# Patient Record
Sex: Female | Born: 1990
Health system: Southern US, Community
[De-identification: ages and names within clinical notes are randomized; demographics above are authoritative.]

## PROBLEM LIST (undated history)

## (undated) ENCOUNTER — Inpatient Hospital Stay (HOSPITAL_COMMUNITY): Payer: Self-pay

## (undated) DIAGNOSIS — F419 Anxiety disorder, unspecified: Secondary | ICD-10-CM

## (undated) DIAGNOSIS — A499 Bacterial infection, unspecified: Secondary | ICD-10-CM

## (undated) DIAGNOSIS — Z8619 Personal history of other infectious and parasitic diseases: Secondary | ICD-10-CM

## (undated) HISTORY — DX: Personal history of other infectious and parasitic diseases: Z86.19

## (undated) HISTORY — DX: Bacterial infection, unspecified: A49.9

---
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
## 2012-01-02 ENCOUNTER — Encounter: Payer: Self-pay | Admitting: Obstetrics and Gynecology

## 2012-02-03 ENCOUNTER — Encounter (INDEPENDENT_AMBULATORY_CARE_PROVIDER_SITE_OTHER): Payer: Medicaid Other

## 2012-02-03 DIAGNOSIS — Z331 Pregnant state, incidental: Secondary | ICD-10-CM

## 2012-02-18 ENCOUNTER — Encounter (INDEPENDENT_AMBULATORY_CARE_PROVIDER_SITE_OTHER): Payer: Medicaid Other | Admitting: Obstetrics and Gynecology

## 2012-02-18 DIAGNOSIS — Z3480 Encounter for supervision of other normal pregnancy, unspecified trimester: Secondary | ICD-10-CM

## 2012-02-18 DIAGNOSIS — N390 Urinary tract infection, site not specified: Secondary | ICD-10-CM

## 2012-02-18 DIAGNOSIS — R300 Dysuria: Secondary | ICD-10-CM

## 2012-02-18 DIAGNOSIS — Z202 Contact with and (suspected) exposure to infections with a predominantly sexual mode of transmission: Secondary | ICD-10-CM

## 2012-02-24 ENCOUNTER — Encounter (INDEPENDENT_AMBULATORY_CARE_PROVIDER_SITE_OTHER): Payer: Medicaid Other | Admitting: Obstetrics and Gynecology

## 2012-02-24 DIAGNOSIS — Z3480 Encounter for supervision of other normal pregnancy, unspecified trimester: Secondary | ICD-10-CM

## 2012-03-04 ENCOUNTER — Encounter (INDEPENDENT_AMBULATORY_CARE_PROVIDER_SITE_OTHER): Payer: Medicaid Other | Admitting: Obstetrics and Gynecology

## 2012-03-04 DIAGNOSIS — Z331 Pregnant state, incidental: Secondary | ICD-10-CM

## 2012-03-08 ENCOUNTER — Encounter (HOSPITAL_COMMUNITY): Payer: Self-pay | Admitting: Anesthesiology

## 2012-03-08 ENCOUNTER — Encounter (HOSPITAL_COMMUNITY): Payer: Self-pay | Admitting: *Deleted

## 2012-03-08 ENCOUNTER — Inpatient Hospital Stay (HOSPITAL_COMMUNITY)
Admission: AD | Admit: 2012-03-08 | Discharge: 2012-03-11 | DRG: 775 | Disposition: A | Discharge status: Home / Self Care | Payer: Managed Care, Other (non HMO) | Attending: Obstetrics and Gynecology | Admitting: Obstetrics and Gynecology

## 2012-03-08 ENCOUNTER — Inpatient Hospital Stay (HOSPITAL_COMMUNITY): Payer: Managed Care, Other (non HMO) | Admitting: Anesthesiology

## 2012-03-08 DIAGNOSIS — IMO0001 Reserved for inherently not codable concepts without codable children: Secondary | ICD-10-CM | POA: Diagnosis present

## 2012-03-08 DIAGNOSIS — D72829 Elevated white blood cell count, unspecified: Secondary | ICD-10-CM | POA: Diagnosis not present

## 2012-03-08 DIAGNOSIS — O99820 Streptococcus B carrier state complicating pregnancy: Secondary | ICD-10-CM

## 2012-03-08 DIAGNOSIS — Z8659 Personal history of other mental and behavioral disorders: Secondary | ICD-10-CM

## 2012-03-08 DIAGNOSIS — O99892 Other specified diseases and conditions complicating childbirth: Principal | ICD-10-CM | POA: Diagnosis present

## 2012-03-08 DIAGNOSIS — Z803 Family history of malignant neoplasm of breast: Secondary | ICD-10-CM

## 2012-03-08 DIAGNOSIS — Z22330 Carrier of Group B streptococcus: Secondary | ICD-10-CM

## 2012-03-08 DIAGNOSIS — O99013 Anemia complicating pregnancy, third trimester: Secondary | ICD-10-CM | POA: Diagnosis not present

## 2012-03-08 DIAGNOSIS — O9081 Anemia of the puerperium: Secondary | ICD-10-CM | POA: Diagnosis present

## 2012-03-08 LAB — CBC
HCT: 37.4 % (ref 36.0–46.0)
Hemoglobin: 12.4 g/dL (ref 12.0–15.0)
MCH: 31 pg (ref 26.0–34.0)
MCHC: 33.2 g/dL (ref 30.0–36.0)
MCV: 93.5 fL (ref 78.0–100.0)
Platelets: 208 K/uL (ref 150–400)
RBC: 4 MIL/uL (ref 3.87–5.11)
RDW: 12.9 % (ref 11.5–15.5)
WBC: 13.9 K/uL — ABNORMAL HIGH (ref 4.0–10.5)

## 2012-03-08 LAB — AMNISURE RUPTURE OF MEMBRANE (ROM) NOT AT ARMC: Amnisure ROM: POSITIVE

## 2012-03-08 LAB — RPR: RPR Ser Ql: NONREACTIVE

## 2012-03-08 LAB — STREP B DNA PROBE: GBS: POSITIVE

## 2012-03-08 LAB — POCT FERN TEST: Fern Test: POSITIVE

## 2012-03-08 MED ORDER — IBUPROFEN 600 MG PO TABS
600.0000 mg | ORAL_TABLET | Freq: Four times a day (QID) | ORAL | Status: DC | PRN
  Administered 2012-03-09: 600 mg via ORAL
  Filled 2012-03-08: qty 1

## 2012-03-08 MED ORDER — PHENYLEPHRINE 40 MCG/ML (10ML) SYRINGE FOR IV PUSH (FOR BLOOD PRESSURE SUPPORT): 80.0000 ug | PREFILLED_SYRINGE | INTRAVENOUS | Status: DC | PRN

## 2012-03-08 MED ORDER — FLEET ENEMA 7-19 GM/118ML RE ENEM: 1.0000 | ENEMA | RECTAL | Status: DC | PRN

## 2012-03-08 MED ORDER — OXYTOCIN BOLUS FROM INFUSION
500.0000 mL | Freq: Once | INTRAVENOUS | Status: DC
  Filled 2012-03-08: qty 500

## 2012-03-08 MED ORDER — OXYTOCIN 20 UNITS IN LACTATED RINGERS INFUSION - SIMPLE
1.0000 m[IU]/min | INTRAVENOUS | Status: DC
  Administered 2012-03-09: 333 m[IU]/min via INTRAVENOUS

## 2012-03-08 MED ORDER — HYDROXYZINE HCL 50 MG PO TABS: 50.0000 mg | ORAL_TABLET | Freq: Four times a day (QID) | ORAL | Status: DC | PRN

## 2012-03-08 MED ORDER — ACETAMINOPHEN 325 MG PO TABS: 650.0000 mg | ORAL_TABLET | ORAL | Status: DC | PRN

## 2012-03-08 MED ORDER — EPHEDRINE 5 MG/ML INJ: 10.0000 mg | INTRAVENOUS | Status: DC | PRN

## 2012-03-08 MED ORDER — HYDROXYZINE HCL 50 MG/ML IM SOLN
50.0000 mg | Freq: Four times a day (QID) | INTRAMUSCULAR | Status: DC | PRN
  Filled 2012-03-08: qty 1

## 2012-03-08 MED ORDER — FENTANYL 2.5 MCG/ML BUPIVACAINE 1/10 % EPIDURAL INFUSION (WH - ANES)
14.0000 mL/h | INTRAMUSCULAR | Status: DC
  Administered 2012-03-08 (×3): 14 mL/h via EPIDURAL
  Filled 2012-03-08 (×3): qty 60

## 2012-03-08 MED ORDER — LACTATED RINGERS IV SOLN
INTRAVENOUS | Status: DC
  Administered 2012-03-08: 950 mL via INTRAVENOUS
  Administered 2012-03-08 (×2): via INTRAVENOUS

## 2012-03-08 MED ORDER — PHENYLEPHRINE 40 MCG/ML (10ML) SYRINGE FOR IV PUSH (FOR BLOOD PRESSURE SUPPORT)
80.0000 ug | PREFILLED_SYRINGE | INTRAVENOUS | Status: DC | PRN
  Filled 2012-03-08: qty 5

## 2012-03-08 MED ORDER — OXYCODONE-ACETAMINOPHEN 5-325 MG PO TABS: 1.0000 | ORAL_TABLET | ORAL | Status: DC | PRN

## 2012-03-08 MED ORDER — FENTANYL 2.5 MCG/ML BUPIVACAINE 1/10 % EPIDURAL INFUSION (WH - ANES): 14.0000 mL/h | INTRAMUSCULAR | Status: DC

## 2012-03-08 MED ORDER — LIDOCAINE HCL (PF) 1 % IJ SOLN
INTRAMUSCULAR | Status: DC | PRN
  Administered 2012-03-08 (×3): 4 mL

## 2012-03-08 MED ORDER — ONDANSETRON HCL 4 MG/2ML IJ SOLN: 4.0000 mg | Freq: Four times a day (QID) | INTRAMUSCULAR | Status: DC | PRN

## 2012-03-08 MED ORDER — PENICILLIN G POTASSIUM 5000000 UNITS IJ SOLR
2.5000 10*6.[IU] | INTRAVENOUS | Status: DC
  Administered 2012-03-08 (×4): 2.5 10*6.[IU] via INTRAVENOUS
  Filled 2012-03-08 (×9): qty 2.5

## 2012-03-08 MED ORDER — EPHEDRINE 5 MG/ML INJ
10.0000 mg | INTRAVENOUS | Status: DC | PRN
  Filled 2012-03-08: qty 4

## 2012-03-08 MED ORDER — OXYTOCIN 20 UNITS IN LACTATED RINGERS INFUSION - SIMPLE: 125.0000 mL/h | Freq: Once | INTRAVENOUS | Status: DC

## 2012-03-08 MED ORDER — LACTATED RINGERS IV SOLN: 500.0000 mL | Freq: Once | INTRAVENOUS | Status: DC

## 2012-03-08 MED ORDER — DIPHENHYDRAMINE HCL 50 MG/ML IJ SOLN: 12.5000 mg | INTRAMUSCULAR | Status: DC | PRN

## 2012-03-08 MED ORDER — LACTATED RINGERS IV SOLN
500.0000 mL | INTRAVENOUS | Status: DC | PRN
  Administered 2012-03-08 (×2): 300 mL via INTRAVENOUS

## 2012-03-08 MED ORDER — PENICILLIN G POTASSIUM 5000000 UNITS IJ SOLR
5.0000 10*6.[IU] | Freq: Once | INTRAVENOUS | Status: AC
  Administered 2012-03-08: 5 10*6.[IU] via INTRAVENOUS
  Filled 2012-03-08: qty 5

## 2012-03-08 MED ORDER — CITRIC ACID-SODIUM CITRATE 334-500 MG/5ML PO SOLN: 30.0000 mL | ORAL | Status: DC | PRN

## 2012-03-08 MED ORDER — LIDOCAINE HCL (PF) 1 % IJ SOLN
30.0000 mL | INTRAMUSCULAR | Status: DC | PRN
  Filled 2012-03-08: qty 30

## 2012-03-08 MED ORDER — BUTORPHANOL TARTRATE 2 MG/ML IJ SOLN
1.0000 mg | INTRAMUSCULAR | Status: DC | PRN
  Administered 2012-03-08 (×2): 1 mg via INTRAVENOUS
  Filled 2012-03-08 (×2): qty 1

## 2012-03-08 MED ORDER — OXYTOCIN 20 UNITS IN LACTATED RINGERS INFUSION - SIMPLE
1.0000 m[IU]/min | INTRAVENOUS | Status: DC
  Administered 2012-03-08: 1 m[IU]/min via INTRAVENOUS
  Filled 2012-03-08: qty 1000

## 2012-03-08 MED ORDER — TERBUTALINE SULFATE 1 MG/ML IJ SOLN: 0.2500 mg | Freq: Once | INTRAMUSCULAR | Status: AC | PRN

## 2012-03-08 NOTE — Progress Notes (Signed)
Subjective: Called to bedside around 2100 b/c pt c/o intermittent "pressure."  Pitocin on 5mu currently.  Objective: BP 99/85  Pulse 85  Temp(Src) 98 F (36.7 C) (Oral)  Resp 18  Ht 4\' 9"  (1.448 m)  Wt 66.225 kg (146 lb)  BMI 31.59 kg/m2  SpO2 100%  LMP 06/14/2011      FHT:  FHR: 140 bpm, variability: moderate,  accelerations:  Present,  decelerations:  Present mild variables UC:   regular, every 2-5 minutes; couplet/triplet pattern at times; MVU's <180 SVE:   Dilation: 9 Effacement (%): 100 Station: -1 Exam by:: H. Esmerelda Finnigan CNM  Labs: Lab Results  Component Value Date   WBC 13.9* 03/08/2012   HGB 12.4 03/08/2012   HCT 37.4 03/08/2012   MCV 93.5 03/08/2012   PLT 208 03/08/2012    Assessment / Plan: 1.  Transition  2.  GBS pos  3.  mild variables  4.  Inadequate MVU's but cervical change  Labor: Progressing normally Preeclampsia:  no signs or symptoms of toxicity Fetal Wellbeing:  Category II Pain Control:  Epidural I/D:  n/a Anticipated MOD:  NSVD 1.  Repositioned to pt's Rt side and will recheck prn or in 1-2 hrs. 2. Amnioinfusion prn 3.  Dr. Normand Sloop updated Antonietta Breach 03/08/2012, 9:25 PM

## 2012-03-08 NOTE — Progress Notes (Signed)
  Subjective: Resting comfortably on right side.  Has received 2nd dose of PCN, on pitocin.  No significant pain with contractions.  Plans pain medication as labor advances.  Objective: BP 110/45  Pulse 77  Temp(Src) 97.8 F (36.6 C) (Oral)  Resp 18  Ht 4\' 9"  (1.448 m)  Wt 146 lb (66.225 kg)  BMI 31.59 kg/m2  LMP 06/14/2011      FHT: Category 1 UC:   Irregular, mild Pitocin on 3 mu/min  Labs: Lab Results  Component Value Date   WBC 13.9* 03/08/2012   HGB 12.4 03/08/2012   HCT 37.4 03/08/2012   MCV 93.5 03/08/2012   PLT 208 03/08/2012    Assessment / Plan: SROM, early labor Positive GBS  Plan: Continue augmentation of labor Pain medication prn.   Kathrynne Kulinski 03/08/2012, 7:59 AM
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
## 2012-03-08 NOTE — Anesthesia Procedure Notes (Signed)
Epidural Patient location during procedure: OB Start time: 03/08/2012 3:42 PM Reason for block: procedure for pain  Staffing Performed by: anesthesiologist   Preanesthetic Checklist Completed: patient identified, site marked, surgical consent, pre-op evaluation, timeout performed, IV checked, risks and benefits discussed and monitors and equipment checked  Epidural Patient position: sitting Prep: site prepped and draped and DuraPrep Patient monitoring: continuous pulse ox and blood pressure Approach: midline Injection technique: LOR air  Needle:  Needle type: Tuohy  Needle gauge: 17 G Needle length: 9 cm Needle insertion depth: 4 cm Catheter type: closed end flexible Catheter size: 19 Gauge Catheter at skin depth: 9 cm Test dose: negative  Assessment Events: blood not aspirated, injection not painful, no injection resistance, negative IV test and no paresthesia  Additional Notes Discussed risk of headache, infection, bleeding, nerve injury and failed or incomplete block.  Patient voices understanding and wishes to proceed.

## 2012-03-08 NOTE — Progress Notes (Addendum)
  Subjective: Comfortable with epidural.  RN reports pitocin turned off at approx 3:30pm at the direction of the Rapid Response nurse due to cycle of decreased variability, but I was never notified.  Objective: BP 106/59  Pulse 66  Temp(Src) 98.1 F (36.7 C) (Oral)  Resp 18  Ht 4\' 9"  (1.448 m)  Wt 146 lb (66.225 kg)  BMI 31.59 kg/m2  SpO2 100%  LMP 06/14/2011      FHT:  Category 1 UC:   q 4-8 min, mild. SVE:   Dilation: 7 Effacement (%): 100 Station: -1 Exam by:: V Antolin Belsito cnm IUPC inserted. Pitocin restarted at 2 mu/min.  Labs: Lab Results  Component Value Date   WBC 13.9* 03/08/2012   HGB 12.4 03/08/2012   HCT 37.4 03/08/2012   MCV 93.5 03/08/2012   PLT 208 03/08/2012    Assessment / Plan: Inadequate labor Plan: Restart pitocin now.   Nigel Bridgeman 03/08/2012, 7:04 PM

## 2012-03-08 NOTE — Anesthesia Preprocedure Evaluation (Signed)

## 2012-03-08 NOTE — MAU Note (Signed)
Pt reports "I'm having bad pains and a discharge". States the discharge was only one time today. uc's are q 4 minutes

## 2012-03-08 NOTE — Progress Notes (Signed)
  Subjective: Resting quietly--breathing with contractions.  Mother, grandmother, and FOB at bedside.  Objective: BP 131/64  Pulse 83  Temp(Src) 97.9 F (36.6 C) (Oral)  Resp 18  Ht 4\' 9"  (1.448 m)  Wt 146 lb (66.225 kg)  BMI 31.59 kg/m2  LMP 06/14/2011      FHT: Category 1 UC:   q 2-3 min. SVE:   Dilation: 6 Effacement (%): 80 Station: -2 Exam by:: V Aydin Hink CNM AROM of forewaters--clear fluid, vtx well applied.  Assessment / Plan: Progressive labor Will continue to observe, continue augmentation.   Nigel Bridgeman 03/08/2012, 2:35 PM

## 2012-03-08 NOTE — Progress Notes (Signed)
  Subjective: Requested more pain medication--not yet decided on epidural.  Objective: BP 112/61  Pulse 72  Temp(Src) 97.9 F (36.6 C) (Oral)  Resp 18  Ht 4\' 9"  (1.448 m)  Wt 146 lb (66.225 kg)  BMI 31.59 kg/m2  LMP 06/14/2011      FHT:  Category 1 UC:   q 3-4 min. SVE:   Dilation: 4.5 Effacement (%): 80 Station: -2 Exam by:: V Cheney Gosch cnm ? Forewaters noted. Pitocin on 8 mu/min  Labs: Lab Results  Component Value Date   WBC 13.9* 03/08/2012   HGB 12.4 03/08/2012   HCT 37.4 03/08/2012   MCV 93.5 03/08/2012   PLT 208 03/08/2012    Assessment / Plan: Active labor, s/p SROM  Plan: Continue to augment. Pain medication prn.  Nigel Bridgeman 03/08/2012, 1:35 PM

## 2012-03-08 NOTE — Progress Notes (Signed)
  Subjective: Requesting pain medication--having back labor.    Objective: BP 110/60  Pulse 80  Temp(Src) 97.8 F (36.6 C) (Oral)  Resp 18  Ht 4\' 9"  (1.448 m)  Wt 146 lb (66.225 kg)  BMI 31.59 kg/m2  LMP 06/14/2011      FHT: Category 1 UC:   q 3-4 min, moderate. SVE:   3 cm, 80%, -2 per RN exam. Pitocin on 8 mu/min  Labs: Lab Results  Component Value Date   WBC 13.9* 03/08/2012   HGB 12.4 03/08/2012   HCT 37.4 03/08/2012   MCV 93.5 03/08/2012   PLT 208 03/08/2012    Assessment / Plan: IUP at term Early labor Positive GBS  Plan: Continue current care.      Nigel Bridgeman 03/08/2012, 10:54 AM

## 2012-03-09 ENCOUNTER — Encounter (HOSPITAL_COMMUNITY): Payer: Self-pay | Admitting: *Deleted

## 2012-03-09 DIAGNOSIS — B951 Streptococcus, group B, as the cause of diseases classified elsewhere: Secondary | ICD-10-CM

## 2012-03-09 LAB — CBC
MCH: 31.1 pg (ref 26.0–34.0)
MCHC: 33.1 g/dL (ref 30.0–36.0)
Platelets: 169 10*3/uL (ref 150–400)
RBC: 3.51 MIL/uL — ABNORMAL LOW (ref 3.87–5.11)
RDW: 13 % (ref 11.5–15.5)

## 2012-03-09 MED ORDER — PRENATAL MULTIVITAMIN CH
1.0000 | ORAL_TABLET | Freq: Every day | ORAL | Status: DC
  Administered 2012-03-09 – 2012-03-10 (×2): 1 via ORAL
  Filled 2012-03-09 (×3): qty 1

## 2012-03-09 MED ORDER — MEDROXYPROGESTERONE ACETATE 150 MG/ML IM SUSP: 150.0000 mg | INTRAMUSCULAR | Status: DC | PRN

## 2012-03-09 MED ORDER — ACETAMINOPHEN 500 MG PO TABS
1000.0000 mg | ORAL_TABLET | Freq: Once | ORAL | Status: AC
  Administered 2012-03-09: 1000 mg via ORAL
  Filled 2012-03-09: qty 2

## 2012-03-09 MED ORDER — DIBUCAINE 1 % RE OINT: 1.0000 "application " | TOPICAL_OINTMENT | RECTAL | Status: DC | PRN

## 2012-03-09 MED ORDER — SENNOSIDES-DOCUSATE SODIUM 8.6-50 MG PO TABS
2.0000 | ORAL_TABLET | Freq: Every day | ORAL | Status: DC
  Administered 2012-03-09: 2 via ORAL

## 2012-03-09 MED ORDER — OXYCODONE-ACETAMINOPHEN 5-325 MG PO TABS: 1.0000 | ORAL_TABLET | ORAL | Status: DC | PRN

## 2012-03-09 MED ORDER — ONDANSETRON HCL 4 MG/2ML IJ SOLN
4.0000 mg | INTRAMUSCULAR | Status: DC | PRN
Start: 2012-03-09 — End: 2012-03-11

## 2012-03-09 MED ORDER — LANOLIN HYDROUS EX OINT: TOPICAL_OINTMENT | CUTANEOUS | Status: DC | PRN

## 2012-03-09 MED ORDER — ZOLPIDEM TARTRATE 5 MG PO TABS: 5.0000 mg | ORAL_TABLET | Freq: Every evening | ORAL | Status: DC | PRN

## 2012-03-09 MED ORDER — BENZOCAINE-MENTHOL 20-0.5 % EX AERO: 1.0000 "application " | INHALATION_SPRAY | CUTANEOUS | Status: DC | PRN

## 2012-03-09 MED ORDER — SIMETHICONE 80 MG PO CHEW
80.0000 mg | CHEWABLE_TABLET | ORAL | Status: DC | PRN
  Administered 2012-03-10: 80 mg via ORAL

## 2012-03-09 MED ORDER — WITCH HAZEL-GLYCERIN EX PADS: 1.0000 "application " | MEDICATED_PAD | CUTANEOUS | Status: DC | PRN

## 2012-03-09 MED ORDER — DIPHENHYDRAMINE HCL 25 MG PO CAPS: 25.0000 mg | ORAL_CAPSULE | Freq: Four times a day (QID) | ORAL | Status: DC | PRN

## 2012-03-09 MED ORDER — MISOPROSTOL 200 MCG PO TABS
800.0000 ug | ORAL_TABLET | Freq: Once | ORAL | Status: AC
  Administered 2012-03-09: 800 ug via RECTAL

## 2012-03-09 MED ORDER — ONDANSETRON HCL 4 MG PO TABS: 4.0000 mg | ORAL_TABLET | ORAL | Status: DC | PRN

## 2012-03-09 MED ORDER — MISOPROSTOL 200 MCG PO TABS
ORAL_TABLET | ORAL | Status: AC
  Filled 2012-03-09: qty 4

## 2012-03-09 MED ORDER — MAGNESIUM HYDROXIDE 400 MG/5ML PO SUSP: 30.0000 mL | ORAL | Status: DC | PRN

## 2012-03-09 MED ORDER — IBUPROFEN 600 MG PO TABS
600.0000 mg | ORAL_TABLET | Freq: Four times a day (QID) | ORAL | Status: DC
  Administered 2012-03-09 – 2012-03-11 (×9): 600 mg via ORAL
  Filled 2012-03-09 (×12): qty 1

## 2012-03-09 MED ORDER — TETANUS-DIPHTH-ACELL PERTUSSIS 5-2.5-18.5 LF-MCG/0.5 IM SUSP
0.5000 mL | Freq: Once | INTRAMUSCULAR | Status: AC
  Administered 2012-03-10: 0.5 mL via INTRAMUSCULAR
  Filled 2012-03-09: qty 0.5

## 2012-03-09 NOTE — Anesthesia Postprocedure Evaluation (Signed)
  Anesthesia Post Note  Patient: Brandi Castillo  Procedure(s) Performed: * No procedures listed *  Anesthesia type: Epidural  Patient location: Mother/Baby  Post pain: Pain level controlled  Post assessment: Post-op Vital signs reviewed  Last Vitals:  Filed Vitals:   03/09/12 0700  BP: 102/67  Pulse: 82  Temp: 37.2 C  Resp: 20    Post vital signs: Reviewed  Level of consciousness:alert  Complications: No apparent anesthesia complications

## 2012-03-09 NOTE — Progress Notes (Addendum)
Post Partum Day 0--s/p SVB Subjective: no complaints.  Up ad lib, no syncope or dizziness.  Undecided regarding contraception.  Breast feeding.  Objective: Blood pressure 102/67, pulse 82, temperature 98.9 F (37.2 C), temperature source Oral, resp. rate 20, height 4\' 9"  (1.448 m), weight 146 lb (66.225 kg), last menstrual period 06/14/2011, SpO2 100.00%.  Physical Exam:  General: alert Lochia: appropriate Uterine Fundus: firm Incision: healing well DVT Evaluation: No evidence of DVT seen on physical exam. Negative Homan's sign.   Basename 03/09/12 0545 03/08/12 0320  HGB 10.9* 12.4  HCT 32.9* 37.4    Assessment/Plan: PP day 0 Continue current care. Reviewed contraceptive options--will review again before d/c.   LOS: 1 day   Generoso Cropper 03/09/2012, 10:42 AM

## 2012-03-10 NOTE — Progress Notes (Signed)
Post Partum Day 1--s/p SVB Subjective: no complaints. Doing well.  Infant having some spitting episodes, but doing well overall.  Objective: Blood pressure 105/68, pulse 71, temperature 98 F (36.7 C), temperature source Oral, resp. rate 18, height 4\' 9"  (1.448 m), weight 146 lb (66.225 kg), last menstrual period 06/14/2011, SpO2 100.00%, unknown if currently breastfeeding.  Physical Exam:  General: alert Lochia: appropriate Uterine Fundus: firm Incision: healing well DVT Evaluation: No evidence of DVT seen on physical exam. Negative Homan's sign.   Basename 03/09/12 0545 03/08/12 0320  HGB 10.9* 12.4  HCT 32.9* 37.4    Assessment/Plan: Continue current care Anticipate d/c tomorrow.   LOS: 2 days   Nigel Bridgeman 03/10/2012, 9:58 AM

## 2012-03-11 ENCOUNTER — Encounter: Payer: Medicaid Other | Admitting: Obstetrics and Gynecology

## 2012-03-11 DIAGNOSIS — O9081 Anemia of the puerperium: Secondary | ICD-10-CM | POA: Diagnosis present

## 2012-03-11 DIAGNOSIS — D72829 Elevated white blood cell count, unspecified: Secondary | ICD-10-CM | POA: Diagnosis not present

## 2012-03-11 LAB — CBC
Platelets: 176 10*3/uL (ref 150–400)
RDW: 13.3 % (ref 11.5–15.5)
WBC: 11.9 10*3/uL — ABNORMAL HIGH (ref 4.0–10.5)

## 2012-03-11 LAB — DIFFERENTIAL
Basophils Absolute: 0 10*3/uL (ref 0.0–0.1)
Basophils Relative: 0 % (ref 0–1)
Lymphocytes Relative: 26 % (ref 12–46)
Neutro Abs: 7.6 10*3/uL (ref 1.7–7.7)
Neutrophils Relative %: 63 % (ref 43–77)

## 2012-03-11 MED ORDER — IBUPROFEN 600 MG PO TABS: 600.0000 mg | ORAL_TABLET | Freq: Four times a day (QID) | ORAL | Status: AC | PRN

## 2012-03-11 NOTE — Discharge Summary (Signed)
Obstetric Discharge Summary Reason for Admission: rupture of membranes Prenatal Procedures: ultrasound Intrapartum Procedures: spontaneous vaginal delivery, GBS prophylaxis and epidural Postpartum Procedures: Tdap Complications-Operative and Postpartum: mild fever immediate PP--resolved w/ interventions and none since; Leukocytosis, but decreasing WBC before d/c home Hemoglobin  Date Value Range Status  03/11/2012 10.1* 12.0-15.0 (g/dL) Final     HCT  Date Value Range Status  03/11/2012 31.1* 36.0-46.0 (%) Final  Hospital Course: Pt presented on night of admission for labor check and found to be ruptured since 0120, with cx=1.5/60/-2.  Pt's labor was augmented with Pitocin.  At 1052 am, cx 3/80/-2.  Just before noon cx=4.5/80/-2 and Pitocin on 8mu.  AROM of forebag around 1435 and cx=6/80/-1.  She did receive IV pain medicine initially before proceeding with epidural around 1540.  Pitocin was discontinued around 1530 by rapid response RN and CNM o/c not notified until change of shift, and Pitocin was restarted at 1900, when IUPC inserted by Nigel Bridgeman, CNM (cx=7/100/-1).  Mild intermittent variables noted through transition and pushing.  Pt complete at 2210, and pushing started at 2215.  SVD at 0034 w/ LNCx1 and double leg cord.  Apgars 2, 8 and cord pH=7.213.   Pt's pp recovery has been unremarkable and BF'ng established.  She has tol diet, up ad lib, and voiding w/o difficulty and PP BM.  Undecided on BC; abstinence until PP appt.     Physical Exam: please see today's progress note.  Discharge Diagnoses: Term Pregnancy-delivered and Lactating; h/o anxiety--stable on no meds; Mild fever immediate PP--none since; Leukocytosis PP--decreasing WBC before d/c home; Prelabor ROM.    Discharge Information: Date: 03/11/2012 PPD#2 Activity: pelvic rest Diet: routine Medications: PNV, Ibuprofen and Colace Condition: stable Instructions: refer to practice specific booklet Discharge to:  home Follow-up Information    Follow up with [facility]. Schedule an appointment as soon as possible for a visit in 6 weeks. (or call as needed with any questions or concerns)          Newborn Data: Live born female "Shaune Spittle"  (delivery provider:  C. Denny Levy, CNM) Birth Weight: 6 lb 7 oz (2920 g) APGAR: 2, 8  Cord pH=7.213  Home with mother.  Roland Prine H 03/11/2012, 10:37 AM

## 2012-03-11 NOTE — Discharge Instructions (Signed)
Postpartum Care After Vaginal Delivery  After you deliver your baby, you will stay in the hospital for 24 to 72 hours, unless there were problems with the labor or delivery, or you have medical problems. While you are in the hospital, you will receive help and instructions on how to care for yourself and your baby.  Your doctor will order pain medicine, in case you need it. You will have a small amount of bleeding from your vagina and should change your sanitary pad frequently. Wash your hands thoroughly with soap and water for at least 20 seconds after changing pads and using the toilet. Let the nurses know if you begin to pass blood clots or your bleeding increases. Do not flush blood clots down the toilet before having the nurse look at them, to make sure there is no placental tissue with them.  If you had an intravenous (IV), it will be removed within 24 hours, if there are no problems. The first time you get out of bed or take a shower, call the nurse to help you because you may get weak, lightheaded, or even faint. If you are breastfeeding, you may feel painful contractions of your uterus for a couple of weeks. This is normal. The contractions help your uterus get back to normal size. If you are not breastfeeding, wear a supportive bra and handle your breasts as little as possible until your milk has dried up. Hormones should not be given to dry up the breasts, because they can cause blood clots. You will be given your normal diet, unless you have diabetes or other medical problems.   The nurses may put an ice pack on your episiotomy (surgically enlarged opening), if you have one, to reduce the pain and swelling. On rare occasions, you may not be able to urinate and the nurse will need to empty your bladder with a catheter. If you had a postpartum tubal ligation ("tying tubes," female sterilization), it should not make your stay in the hospital longer.  You may have your baby in your room with you as much as  you like, unless you or the baby has a problem. Use the bassinet (basket) for the baby when going to and from the nursery. Do not carry the baby. Do not leave the postpartum area. If the mother is Rh negative (lacks a protein on the red blood cells) and the baby is Rh positive, the mother should get a Rho-gam shot to prevent Rh problems with future pregnancies.  You may be given written instructions for you and your baby, and necessary medicines, when you are discharged from the hospital. Be sure you understand and follow the instructions as advised.  HOME CARE INSTRUCTIONS   · Follow instructions and take the medicines given to you.   · Only take over-the-counter or prescription medicines for pain, discomfort, or fever as directed by your caregiver.   · Do not take aspirin, because it can cause bleeding.   · Increase your activities a little bit every day to build up your strength and endurance.   · Do not drink alcohol, especially if you are breastfeeding or taking pain medicine.   · Take your temperature twice a day and record it.   · You may have a small amount of bleeding or spotting for 2 to 4 weeks. This is normal.   · Do not use tampons or douche. Use sanitary pads.   · Try to have someone stay and help you for a   the baby is sleeping.   If you are breastfeeding, wear a good support bra. If you are not breastfeeding, wear a supportive bra and do not stimulate your nipples.   Eat a healthy, nutritious diet and continue to take your prenatal vitamins.   Do not drive, do any heavy activities, or travel until your caregiver tells you it is okay.   Do not have intercourse until your caregiver gives you permission to do so.   Ask your caregiver when you can begin to exercise and what type of exercises to do.   Call your caregiver if you think you are having a problem from your delivery.   Call your pediatrician if you are having a problem  with the baby.   Schedule your postpartum visit and keep it.  SEEK MEDICAL CARE IF:   You have a temperature of 100 F (37.8 C) or higher.   You have increased vaginal bleeding or are passing clots. Save any clots to show your caregiver.   You have bloody urine or pain when you urinate.   You have a bad smelling vaginal discharge.   You have increasing pain or swelling on your episiotomy.   You develop a severe headache.   You feel depressed.   The episiotomy is separating.   You become dizzy or lightheaded.   You develop a rash.   You have a reaction or problems with your medicine.   You have pain, redness, or swelling at the intravenous site.  SEEK IMMEDIATE MEDICAL CARE IF:   You have chest pain.   You develop shortness of breath.   You pass out.   You develop pain, with or without swelling or redness in your leg.   You develop heavy vaginal bleeding, with or without blood clots.   You develop stomach pain.   You develop a bad smelling vaginal discharge.  MAKE SURE YOU:   Understand these instructions.   Will watch your condition.   Will get help right away if you are not doing well or get worse.  Document Released: 09/13/2007 Document Revised: 11/05/2011 Document Reviewed: 09/25/2009 [employer] Patient Information 2012 [employer]  Breastfeeding Challenges Breastfeeding is often the best way to feed your baby. Challenges may discourage you from breastfeeding. But solutions can usually be found to help you. Some babies have conditions that may interfere with or make breastfeeding more difficult. But, in all of the following cases, breastfeeding is still best for a baby's health.  ADVANTAGES OF BREASTFEEDING  Breastfed babies tend to be healthier and less affected by disease.   Breastfed babies may have better brain development and be less likely to be overweight than formulafed babies.   Breastfeeding also benefits the mother. It will give you time  to be close to your baby and helps create a strong bond. It also:   Delays the return of your periods.   Stimulates your uterus to contract back to normal.   Helps you lose some of the weight you gained during pregnancy.   Breastfeeding is also cheaper than formula feeding. It also does not require mixing formula, heating bottles, or washing extra dishes.   Breastfeeding mothers have a lower risk of developing breast cancer.   Breastfeeding should be encouraged in women with gestational diabetes and diabetes type I and type II.  BREASTFEEDING CHALLENGES  Breastfeeding involves taking the time to get to know your baby's patterns and responding to his or her cues. Once breastfeeding is well established, feedings usually   more regular and more widely spaced. Some mothers do not nurse their babies because they come across problems early on. If at all possible, begin breastfeeding your baby within an hour after delivery. The first milk you produce is called colostrum. It is packed with nutrients and disease-fighting substances. These will help nourish and protect your baby against infections as he or she grows up.   Some babies are unable to breastfeed because of premature birth and small size along with weakness and difficulty sucking. Sometimes with birth defects of the mouth (cleft lip or cleft palate) the mother may be able to pump breastmilk to give to her baby. Some digestive problems (breast milk jaundice, galactosemia) may be reasons not to nurse. See a lactation consultant if you have a breast infection or breast abscess, breast cancer or other cancer, previous surgery or radiation treatment, or inadequate milk supply (uncommon).  SOME MOTHERS ARE ADVISED NOT TO BREASTFEED DUE TO HEALTH PROBLEMS SUCH AS:  Serious illnesses.   Severe malnutrition.   Undergoing radiation therapy.   Taking psychiatric medication.   Active herpes lesions on the breast.   Chickenpox or shingles.    Active, untreated tuberculosis.   HIV (human immunodeficiency virus) infection.   Drug or alcohol addiction.   Undergoing radioactive iodine therapy.   Leukemia human cell Type I or II.  BREASTFEEDING SHOULD NOT BE PAINFUL  It is natural for minor problems to arise in first time breastfeeding. Problems you may have and some solutions are as follows:   Nipple soreness may be caused by:   Improper position of baby.   Improper feeding techniques.   Improper nipple care.   For many women, there is no identified cause. A simple change in your baby's position while feeding may relieve nipple soreness. Some breastfeeding mothers report nipple soreness only during the initial adjustment period.   If there is tenderness at first, it should gradually go away as the days go by. Poor latch-on and positioning are common causes of sore nipples. This is usually because the baby is not getting enough of the areola into his or her mouth, and is sucking mostly on the nipple. The areola is the colored portion around the nipple. In general, nurse early and often. Nurse with the nipple and areola in the baby's mouth, not just the nipple. And feed your baby on demand.   If you have sore nipples and put off feedings because of the pain, this can lead to your breasts becoming overly full. This may lead to plugged milk ducts in the breast followed by engorgement or even infection of the breasts. If your baby is latched on correctly, he or she should be able to nurse as long as needed without causing any pain. If it hurts, take the baby off of your breast and try again. Ask for help if it is still painful for you.   Check the positioning of your baby's body and the way she latches on and sucks. To minimize soreness, your baby's mouth should be open wide with as much of the areola in his or her mouth as possible. You should find that it feels better right away once the baby is positioned correctly.   Do not  delay feedings. Try to relax so your let-down reflex comes easily. You also can hand-express a little milk before beginning the feeding so your baby does not clamp down harder, waiting for the milk to come.   If your nipples are very sore, it   sore, it may be helpful to change positions each time you nurse. This puts the pressure on a different part of the nipple.   After nursing, you can also express a few drops of milk and gently rub it on your nipples. Human milk has natural healing properties. Let your nipples air-dry after feeding, or wear a soft-cotton shirt.   Wearing a nipple shield during nursing will not relieve sore nipples. They actually can prolong soreness by making it hard for the baby to learn to nurse without the shield.   Avoid wearing bras or clothes that are too tight and put pressure on your nipples. If you wear a bra, get one that offers good support to your breasts.   Change nursing pads often to avoid trapping in moisture. Only use cotton pads.   Avoid using soap, ointments or other chemicals on your nipples. Make sure to avoid products that must be removed before nursing. Washing with clean water is all that is necessary to keep your nipples and breasts clean.   Try rubbing pure lanolin on your nipples after breastfeeding to soothe the pain.   Making sure you get enough rest, eating healthy foods, and getting enough fluids also can help the healing process. If you have very sore nipples, you can ask your caregiver about using non-aspirin pain relievers.   Another cause of sore nursing is a condition called thrush. This is a fungal infection that can form on your nipples from the milk. Other signs of thrush include itching, flaking and drying skin, tender or pink skin. The infection also can form in the baby's mouth from having contact with your nipples. There it appears as little white spots on the inside of the cheeks, gums, or tongue. It also can appear as a diaper rash on your  baby that will not go away by using regular diaper rash ointments. If you have any of these symptoms or think you have thrush, contact your doctor and your baby's doctor, or a lactation consultant. A lactation consultant is a breastfeeding consultant or expert. You can get medication for your nipples and for your baby.   If you still have sore nipples after following the above tips, you may need to see someone who is trained in breastfeeding, like a lactation consultant.  ENGORGEMENT Engorgement is a condition after pregnancy, when your breasts feel very hard and painful. You also may have breast swelling, tenderness, warmth, redness, throbbing and flattening of the nipple. Engorgement may cause a low-grade fever. This can be confused with a breast infection. Engorgement is the result of the milk building up. It usually happens during the third to fifth day after birth. This slows circulation. When blood and lymph move through the breasts, fluid from the blood vessels can seep into the breast tissues. All of the following can cause engorgement.  Poor positioning.   Infrequent feedings.   Giving supplementary bottles of water, juice, formula, or breast milk or using a pacifier. All of these cut down on your feeding and may lead to engorgement.   Changing the breastfeeding schedule with decreasing in feeding.   The baby changes the nursing pattern.   Having a baby with a weak suck who is not able to nurse effectively.   Fatigue, stress, or anemia in the mother.   An overabundant milk supply.   Nipple damage.   Breast abnormalities.  Engorgement can lead to plugged ducts or a breast infection. So it is important to try to prevent   this happens. If treated properly, engorgement should only last for one to two days.  Minimize engorgement by making sure the baby is latched on and positioned correctly at your breast.   Nurse frequently after birth. Allow the baby to nurse as long as  he/she likes, as long as he/she is latched on well and sucking effectively.   In the early days when your milk is coming in, you should awaken a sleepy baby every 2 to 3 hours to breastfeed. Breastfeeding often on the affected side helps to remove the milk and keeps milk moving freely. This prevents overfilling of the breast.   Avoid additional bottles and pacifiers.   Try hand expressing or pumping a little milk to first soften the breast, areola, and nipple before breastfeeding. Or massage the breast before feeding.   Cold compresses in between feedings can help ease pain and swelling.   If you are returning to work, try to pump your milk on the same schedule your baby was fed.   Eat a well balanced diet and drink plenty of fluids.   Use a well-fitting, supportive bra that is not too tight.   If your engorgement lasts for more than two days even after treating it, contact a lactation consultant.   Use a breast pump to keep up with your nursing schedule.   Use a breast pump if your baby is not taking enough milk or you feel you may be getting engorged.  PLUGGED DUCTS AND INFECTION Plugged ducts and breast infection (mastitis) are common sources of sore breasts postpartum. It is common for many women to have a plugged duct in the breast at some point if breastfeeding.   A plugged milk duct feels like a tender, sore, lump in the breast. It is not accompanied by a fever or other symptoms. It happens when a milk duct does not properly drain. Then, pressure builds up behind the plug, and surrounding tissue becomes inflamed. A plugged duct usually only occurs in one breast at a time.   A breast infection (mastitis), on the other hand, is soreness or a lump in the breast that can be accompanied by a fever and/or flu-like symptoms. You may feel run down or very achy. Some women with a breast infection also have nausea and vomiting. You also may have yellowish discharge from the nipple that looks  like colostrum. Or the breasts feel warm or hot to the touch and appear pink or red. A breast infection can occur when other family members have a cold or the flu. Like a plugged duct, it usually only occurs in one breast. It is not always easy to tell the difference between a breast infection and a plugged duct. Both have similar symptoms and can improve within 24 to 48 hours.   Treatment for plugged ducts and breast infections is similar. But some breast infections need to also be treated with an antibiotic.   If mastitis is not treated quickly, it may lead to a breast abscess.   It may help to massage the area, starting behind the sore spot. Use your fingers in a circular motion and massage toward the nipple.   Breastfeed more often on the affected side. This helps loosen the plug, keeps the milk moving freely, and the breast from becoming overly full. Nursing every two hours, both day and night on the affected side first can be helpful.   Getting extra sleep or relaxing with your feet up can help speed healing. Often   healing. Often a plugged duct or breast infection is the first sign that a mother is doing too much and becoming overly tired.   Wear a well-fitting supportive bra that is not too tight, since this can constrict milk ducts.   If you do not feel better within 24 hours of trying these steps, and you have a fever or your symptoms worsen, call your doctor. You may need an antibiotic. Also, if you have a breast infection in which both breasts look affected, or there is pus or blood in the milk, red streaks near the area, or your symptoms came on severely and suddenly, see your doctor right away.   Even if you need an antibiotic, continuing to breastfeed during treatment is best for both you and your baby. Most antibiotics will not affect your baby through your breast milk.  THRUSH Thrush (yeast) is a fungal infection that can form on your nipples or in your breast because it thrives on milk. The  infection forms from an overgrowth of the candida organism. Candida usually exists in our bodies and is kept at healthy levels by the natural bacteria in our bodies. But, when the natural balance of bacteria is upset, candida can overgrow, causing an infection. Some of the things that can cause thrush include:   Having an overly moist environment on your skin or nipples that are sore or cracked.   Taking antibiotics, birth control pills or steroids.   Having a diet that contains large amounts of sugar or foods with yeast.   Having a chronic illness like HIV infection, diabetes, or anemia.  If you have sore nipples that last more than a few days even after you make sure your baby's latch and positioning is correct, or you suddenly get sore nipples after several weeks of unpainful nursing, you could have thrush. Some other signs of thrush include:   Pink, flaky, shiny, itchy or cracked nipples.   Deep pink and blistered nipples.   You also could have shooting pains deep in the breast during or after feedings, or achy breasts.  The infection also can form in your baby's mouth from having contact with your nipples, and appear as little white spots on the inside of the cheeks, gums, or tongue. It also can appear as a diaper rash (small red dots around a rash) on your baby that will not go away by using regular diaper rash ointments. Many babies with thrush refuse to nurse, or are gassy or cranky.  Solution:   If you or your baby have any of these symptoms, contact your doctor and your baby's doctor so you both can be correctly diagnosed.   You can get medication for your nipples and for your baby. Medication for a mother is usually an ointment for the nipples. Your baby can be given a liquid medication for his/her mouth, and/or an ointment for any diaper rash.   Change disposable nursing pads often. Wash any towels or clothing that come in contact with the yeast in very hot water (above 122 F or  50 C).   Wear a clean bra every day. Wash your hands often, and wash your baby's hands often, especially if he or she sucks on his/her fingers.   Only use cotton pads.   Boil any pacifiers, bottle nipples, or toys your baby puts in his or her mouth once a day for 20 minutes to kill the thrush. After one week of treatment, discard pacifiers and nipples and buy new   Boil daily for 20 minutes all breast pump parts that touch the milk.   Make sure other family members are free of thrush or other fungal infections. If they have symptoms, get them treatment.  NURSING STRIKE A nursing strike is when your baby has been nursing well for months, then suddenly loses interest in breastfeeding and begins to refuse the breast. A nursing strike can mean several things are happening with your baby and that she or he is trying to communicate with you to let you know that something is wrong. Not all babies will react the same to different situations that can cause a nursing strike. Some will continue to breastfeed without a problem, others may just become fussy at the breast, and others will refuse the breast entirely. Some of the major causes of a nursing strike include:  Mouth pain from teething, a fungal infection, or a cold sore.   An ear infection.   Pain from a certain nursing position.   Being upset about a long separation from the mother or a major change in routine.   Being interested in other things around him or her.   A cold or stuffy nose that makes breathing difficult.   Reduced milk supply from supplementing with bottles or overuse of a pacifier.   Responding to the mother's strong reaction if the baby has bitten her.   Being upset about hearing arguing or people talking in a harsh voice with other family members while nursing.   Reacting to stress, over-stimulation, or having been repeatedly put off when wanting to nurse.   If your baby is on a nursing strike, it is normal to  feel frustrated and upset, especially if your baby is unhappy. It is important not to feel guilty or that you have done something wrong. Your breasts also may become uncomfortable as the milk builds up.  Treatment  Try to express your milk manually or with a breast pump on the same schedule as the baby used to breastfeed to avoid engorgement and plugged ducts.   Try another feeding method temporarily to give your baby your milk, such as a cup, dropper, or spoon. Keep track of your baby's wet diapers to make sure he/she is getting enough milk (5 to 6 per day).   Keep offering your breast to the baby. If the baby is frustrated, stop and try again later. Try when the baby is sleeping or very sleepy.   Try various breastfeeding positions.   Focus on the baby with all of your attention and comfort him or her with extra touching and cuddling.   Try nursing while rocking and in a quiet room free of distractions.  INVERTED OR LARGE NIPPLES Some women have nipples that naturally are inverted, or that turn inward instead of protruding, or that are flat and do not protrude. Inverted or flat nipples can sometimes make it harder to breastfeed because your baby can have a harder time latching on. But remember that for breastfeeding to work, your baby has to latch on to both the nipple and the breast, so even inverted nipples can work just fine. Very large nipples can make it hard for the baby to get enough of the areola into his or her mouth to compress the milk ducts and get enough milk.  Know what type of nipples you have before you have your baby, so you can be prepared in case you have a problem getting your baby to latch on correctly.     correctly.   Talk with a lactation consultant at the hospital or at a breastfeeding clinic for extra help if you have flat, inverted, or very large nipples.   Sometimes a lactation consultant can help inverted nipples to be pulled out with a small device before your baby is brought to  your breast.   In many cases, inverted nipples will protrude more as the baby starts to latch on and as time passes. The baby's sucking will help.   Flat nipples cause fewer problems than inverted nipples. Good latch-on and positioning are usually enough to ensure that a baby latched to a flat nipple breastfeeds well.   The latch for babies of mothers with very large nipples will improve with time as the baby grows. In some cases, it might take several weeks to get the baby to latch well. A good milk supply helps insure that her baby will get enough milk.  RETURNING TO WORK More and more women are breastfeeding when they return to work because they believe in the benefits of breastfeeding. You can purchase or rent effective breast pumps and storage containers for your milk. Many employers are willing to set up special rooms for mothers who pump. But others are not as educated about the benefits of breastfeeding. Also, many women are not able to take off as much time as they'd like after having their babies and might have to return to work before breastfeeding is well established. After you have your baby, take as much time off as possible. This will help you get your breastfeeding established well and may reduce the number of months you may need to pump your milk while you are at work.   If you plan to have your baby take a bottle of expressed breast milk while you are at work, you can introduce your baby to a bottle when he or she is around four weeks old. Otherwise, the baby might not accept the bottle later on. Once your baby is comfortable taking a bottle, it is a good idea to have Dad or another family member offer a bottle of pumped breast milk on a regular basis so the baby stays in practice.   Let your employer or human resources manager know that you plan to continue breastfeeding once you return to work. Before you return to work, or even before you have your baby, start talking with your  employer about breastfeeding. Do not be afraid to request a clean and private area where you can pump your milk. If you do not have your own office space, you can ask to use a supervisor's office during certain times. Or you can ask to have a clean, clutter free corner of a storage room. All you need is a chair, a small table, and an outlet if you are using an electric pump. Many electric pumps also can run on batteries and do not require an outlet. You can lock the door and place a small sign on it that asks for some privacy. You can pump your breast milk during lunch or other breaks. You could suggest to your employer that you are willing to make up work time for time spent pumping milk.   After pumping, you can refrigerate your milk, place it in a cooler, or freeze it for the baby to be fed later. Many breast pumps come with carrying cases that have a section to store your milk with ice packs. If you do not have access to a refrigerator,   it at room temperatures for up to 6 hours.   Many employers are not aware of state laws that state they have to allow you to breastfeed at your job. Under these laws, your employer is required to set up a space for you to breastfeed and/or allow paid/unpaid time for breastfeeding employees. To see if your state has a breastfeeding law for employers, go to http://www.llli.org/Law/LawBills.html or call us at 1-800-LALECHE (in [place]).  JAUNDICE  Jaundice is a condition that is common in many newborns. It appears as a yellowing of the skin and eyes. It is caused by an excess of bilirubin, a yellow pigment that is a product in the blood. All babies are born with extra red blood cells that undergo a process of being broken down and eliminated from the body. Bilirubin levels in the blood can be high because of:  Higher production of it in a newborn.   Increased ability of the newborn intestine to absorb it.   Limited ability of the newborn liver to handle large  amounts of it.  Many cases of jaundice do not need to be treated. Your baby's doctor will carefully monitor your baby's bilirubin levels. Sometimes infants have to be temporarily separated from their mothers to receive special treatment with phototherapy (aiming lights on the baby). In these cases, breastfeeding is encouraged but supplements may also be given to the baby. American Academy of Pediatrics advises against stopping breastfeeding in jaundiced babies and suggests continuing frequent breastfeeding, even during treatment. If your baby is jaundiced or develops jaundice, it is important to discuss with your baby's caregiver all possible treatment options. Share that you do not want to interrupt nursing if this is at all possible.  REFLUX  It is not unusual for babies to spit up after nursing. Usually, babies can spit up and show no other signs of illness. The spitting up disappears as the baby's digestive system matures. As long as the baby has 6 to 8 wet diapers and at least 2 bowel movements in a 24 hour period (under 6 weeks of age), and your baby is gaining weight (at least 4 ounces a week) you can be assured your baby is getting enough milk.  However, some babies have a condition called gastroesophageal reflux (GER). This happens when the muscle at the opening of the stomach opens at the wrong times, allowing milk and food to come back up into the the tube in the throat (esophagus). Symptoms of GER can include:   Crying as if in discomfort.   Waking up frequently at night.   Problems swallowing.   Frequent red or sore throat.   Signs of asthma, bronchitis, wheezing or problems breathing.   Projectile vomiting.   Arching of the back as if in severe pain.   Slow weight gain.   Gagging or choking.   Frequent hiccupping or burping.   Severe spitting up, or spitting up after every feeding, or hours after eating.   Refusal to eat.  Many healthy babies might have some of these  symptoms and do not have GER. But there are babies who might only have a few of these symptoms and have a severe case of GER. Not all babies with GER spit up or vomit.  Some babies with GER do not have a serious medical problem. But caring for them can be hard since they tend to be very fussy and wake up frequently at night. More severe cases of GER may need to be treated   with medication if the baby, in addition to spitting up, also refuses to nurse, gains weight poorly or is losing weight, or has periods of gagging or choking.   If your baby spits up after every feeding and has any of the other symptoms mentioned above, it is best to see his or her doctor for a correct diagnosis. Other than GER, your baby could have another condition that needs treatment. If there are no other signs of illness, he/she could just be sensitive to a food in your diet or a medication he/she is receiving. If your baby has GER, it is important to try to continue to breastfeed since breast milk still is more easily digested than formula. Try smaller, more frequent feedings, thorough burping, and putting the baby in an upright position during and after feedings.  CLEFT PALATE AND CLEFT LIP  Cleft palate and cleft lip are some of the most common birth defects that happen as a baby is developing in the womb. A cleft, or opening, in either the palate or lip can happen together or separately and both can be corrected through surgery. Both conditions can prevent babies from breastfeeding because a baby cannot form a good seal around the nipple and areola with his or her mouth, or get milk out of the breast well.   Cleft palate can happen on one or both sides of a baby's mouth and be partial or complete. Right after birth, a mother whose baby has a cleft palate can try to breastfeed her baby, and she can start expressing her milk right away to keep up her supply. Even if her baby cannot latch on well to her breast, the baby can be fed  breast milk by cup. In some hospitals, babies with cleft palate are fitted with a mouthpiece called an obturator. This fits into the cleft and seals it for easier feeding. The baby should be able to exclusively breastfeed after surgery.   Cleft lip can happen on one or both sides of a baby's lip. But a mother can try different breastfeeding positions and use her thumb or breast to help fill in the opening left by the lip to form a seal around the breast. With cleft lip repair, breastfeeding may only have to be stopped for a few hours.   If your baby is born with a cleft palate or cleft lip, talk with a lactation consultant in the hospital for assistance as soon as possible. Human milk and early breastfeeding is still best for your baby's health.  TWINS OR MULTIPLES Mothers of twins or multiples might feel overwhelmed with the idea of breastfeeding more than one baby at a time. The benefits of human milk to both these mothers and babies are the same as for all mothers and babies. When breastfeeding twins, your breast milk will increase to the amount the babies will need. You will have to take in more calories and liquids when nursing twins. If the babies are premature and unable to nurse, you can pump your breasts and freeze the milk until the babies are ready to nurse. But mothers of multiples get even more benefits from breastfeeding:  Their uterus contracts, which is helpful because it has stretched even more to hold more than one baby.   Hormones are released that relax the mother, which is helpful with the added stress of caring for more than one baby.   Eight to ten hours per week are saved because there is no need to prepare  need to prepare formula or bottles and the mother's milk is available right away.   Breastfeeding early and often for a mother of multiples is important to keep up her milk supply. A good latch-on for each baby is important to avoid sore nipples. Many mothers find that it is easier to nurse  the babies together rather than separately, and that it gets easier as the babies get older. There are many breastfeeding holds that make it easier to nurse more than one baby at a time. If you are having multiples, talk with a lactation consultant about more ways you can successfully breastfeed your babies.  BREASTFEEDING DURING PREGNANCY While most mothers who are nursing a toddler stop breastfeeding if they find out they are pregnant, it is an individual choice to decide whether to keep breastfeeding during the pregnancy. It is not unsafe for the unborn child if you continue to breastfeed an older child during this time. But, if you are having some problems in your pregnancy such as uterine pain or bleeding, a history of preterm labor or problems gaining weight during pregnancy, your doctor may advise you to wean. Your child also may decide to wean on his or her own because pregnancy changes the amount and flavor of your milk. Some women also choose to wean at this time because they have nipple soreness caused by pregnancy hormones, are nauseous, tire more easily, or find that their growing stomachs make breastfeeding uncomfortable. You will need more calories when you breastfeed while pregnant. Your milk production usually slows down around the fourth month of pregnancy.  BREASTFEEDING AFTER BREAST SURGERY   If you have had breast surgery, including breast implants, you might be worried about whether you will be able to breastfeed. The most important things that affect whether you can produce enough milk for your baby are how your surgery was done and where your incisions are, and the reasons for your surgery. For example, women who have had incisions in the fold under the breasts are less likely to have problems producing milk than women who have had incisions around or across the areola. Incisions around the areola can cut into milk ducts and nerves, where milk is produced and travels. Women who have had  breast surgery to augment breasts that never fully developed may not have enough glands to produce a full milk supply.   If you had breast surgery and are worried about how it will affect breastfeeding, talk with a lactation consultant. If you are planning breast surgery and are worried about how it will affect breastfeeding, talk with your surgeon about ways he or she can preserve as much of the breast tissue and milk ducts as possible.  INDUCING LACTATION  Many mothers who adopt want to breastfeed their babies and can do it successfully with some help. It is successful ? to  of the time it is tried. Many will need to supplement their breast milk with donated breast milk or infant formula. But some adoptive mothers can breastfeed exclusively, especially if they have been pregnant before. Lactation is a hormonal response to a physical action. So the stimulation of the baby nursing causes the body to see a need for and produce milk. The more the baby nurses, the more a woman's body will produce milk.   Beginning a combination of hormone treatment several months before the baby is born should help facilitate lactation in many cases.   One thing you can do to prepare is to pump   around the clock for two to three weeks before your baby arrives. Or you can wait until the baby arrives and starts to nurse. Breast milk can be frozen until you are ready to nurse the baby. A supplemental nursing system (SNS) or a lactation aid can help ensure that your baby gets enough nutrition and that your breasts are stimulated to produce milk at the same time.   If you are an adoptive mother who wants to breastfeed, you should see both a Advertising copywriter and your doctor for help in establishing an initial milk supply.  FOR MORE INFORMATION [employer]: www.llli.org Document Released: 05/10/2006 Document Revised: 11/05/2011 Document Reviewed: 08/01/2008 [facility] Patient Information 2012  [place], [place]. Contraception Choices Contraception (birth control) is the use of any methods or devices to prevent pregnancy. Below are some methods to help avoid pregnancy. HORMONAL METHODS   Contraceptive implant. This is a thin, plastic tube containing progesterone hormone. It does not contain estrogen hormone. Your caregiver inserts the tube in the inner part of the upper arm. The tube can remain in place for up to 3 years. After 3 years, the implant must be removed. The implant prevents the ovaries from releasing an egg (ovulation), thickens the cervical mucus which prevents sperm from entering the uterus, and thins the lining of the inside of the uterus.   Progesterone-only injections. These injections are given every 3 months by your caregiver to prevent pregnancy. This synthetic progesterone hormone stops the ovaries from releasing eggs. It also thickens cervical mucus and changes the uterine lining. This makes it harder for sperm to survive in the uterus.   Birth control pills. These pills contain estrogen and progesterone hormone. They work by stopping the egg from forming in the ovary (ovulation). Birth control pills are prescribed by a caregiver.Birth control pills can also be used to treat heavy periods.   Minipill. This type of birth control pill contains only the progesterone hormone. They are taken every day of each month and must be prescribed by your caregiver.   Birth control patch. The patch contains hormones similar to those in birth control pills. It must be changed once a week and is prescribed by a caregiver.   Vaginal ring. The ring contains hormones similar to those in birth control pills. It is left in the vagina for 3 weeks, removed for 1 week, and then a new one is put back in place. The patient must be comfortable inserting and removing the ring from the vagina.A caregiver's prescription is necessary.   Emergency contraception. Emergency contraceptives prevent  pregnancy after unprotected sexual intercourse. This pill can be taken right after sex or up to 5 days after unprotected sex. It is most effective the sooner you take the pills after having sexual intercourse. Emergency contraceptive pills are available without a prescription. Check with your pharmacist. Do not use emergency contraception as your only form of birth control.  BARRIER METHODS   Female condom. This is a thin sheath (latex or rubber) that is worn over the penis during sexual intercourse. It can be used with spermicide to increase effectiveness.   Female condom. This is a soft, loose-fitting sheath that is put into the vagina before sexual intercourse.   Diaphragm. This is a soft, latex, dome-shaped barrier that must be fitted by a caregiver. It is inserted into the vagina, along with a spermicidal jelly. It is inserted before intercourse. The diaphragm should be left in the vagina for 6 to 8 hours after  intercourse.   Cervical cap. This is a round, soft, latex or plastic cup that fits over the cervix and must be fitted by a caregiver. The cap can be left in place for up to 48 hours after intercourse.   Sponge. This is a soft, circular piece of polyurethane foam. The sponge has spermicide in it. It is inserted into the vagina after wetting it and before sexual intercourse.   Spermicides. These are chemicals that kill or block sperm from entering the cervix and uterus. They come in the form of creams, jellies, suppositories, foam, or tablets. They do not require a prescription. They are inserted into the vagina with an applicator before having sexual intercourse. The process must be repeated every time you have sexual intercourse.  INTRAUTERINE CONTRACEPTION  Intrauterine device (IUD). This is a T-shaped device that is put in a woman's uterus during a menstrual period to prevent pregnancy. There are 2 types:   Copper IUD. This type of IUD is wrapped in copper wire and is placed inside the  uterus. Copper makes the uterus and fallopian tubes produce a fluid that kills sperm. It can stay in place for 10 years.   Hormone IUD. This type of IUD contains the hormone progestin (synthetic progesterone). The hormone thickens the cervical mucus and prevents sperm from entering the uterus, and it also thins the uterine lining to prevent implantation of a fertilized egg. The hormone can weaken or kill the sperm that get into the uterus. It can stay in place for 5 years.  PERMANENT METHODS OF CONTRACEPTION  Female tubal ligation. This is when the woman's fallopian tubes are surgically sealed, tied, or blocked to prevent the egg from traveling to the uterus.   Female sterilization. This is when the female has the tubes that carry sperm tied off (vasectomy).This blocks sperm from entering the vagina during sexual intercourse. After the procedure, the man can still ejaculate fluid (semen).  NATURAL PLANNING METHODS  Natural family planning. This is not having sexual intercourse or using a barrier method (condom, diaphragm, cervical cap) on days the woman could become pregnant.   Calendar method. This is keeping track of the length of each menstrual cycle and identifying when you are fertile.   Ovulation method. This is avoiding sexual intercourse during ovulation.   Symptothermal method. This is avoiding sexual intercourse during ovulation, using a thermometer and ovulation symptoms.   Post-ovulation method. This is timing sexual intercourse after you have ovulated.  Regardless of which type or method of contraception you choose, it is important that you use condoms to protect against the transmission of sexually transmitted diseases (STDs). Talk with your caregiver about which form of contraception is most appropriate for you. Document Released: 11/16/2005 Document Revised: 11/05/2011 Document Reviewed: 03/25/2011 [facility] Patient Information 2012 [place], [place].

## 2012-03-11 NOTE — Progress Notes (Signed)
Post Partum Day 2 Subjective: no complaints, up ad lib, voiding, tolerating PO, + flatus and BM since delivery; reports one episode where she "couldn't control" her "bladder."  VB lighter.  BF'ng established.  Undecided on [facility].  Pain controlled w/ Motrin  Objective: Blood pressure 102/61, pulse 67, temperature 97.8 F (36.6 C), temperature source Oral, resp. rate 18, height 4\' 9"  (1.448 m), weight 66.225 kg (146 lb), last menstrual period 06/14/2011, SpO2 100.00%, unknown if currently breastfeeding.  Physical Exam:  General: alert, cooperative, no distress and smiling and pleasant Lochia: appropriate Uterine Fundus: firm, below umbilicus.   Incision: n/a DVT Evaluation: No evidence of DVT seen on physical exam. Negative Homan's sign. No significant calf/ankle edema.   Basename 03/11/12 0535 03/09/12 0545  HGB 10.1* 10.9*  HCT 31.1* 32.9*    Assessment/Plan: Discharge home, Breastfeeding and Contraception Undecided.  H/o anxiety--stable on no meds.  Lactating.  PP leukocytosis--decreasing WBC before d/c.  Mild PP temp right after delivery--none since. D/c home per [employer].  Continue PNV; colace prn.  Rx for Motrin; Tylenol prn. Abstinence until 6 wk visit.  F/u 6 wks or prn.    LOS: 3 days   Rashad Auld H 03/11/2012, 10:31 AM

## 2012-03-11 NOTE — Progress Notes (Signed)
UR Chart review completed.  

## 2012-04-20 ENCOUNTER — Ambulatory Visit (INDEPENDENT_AMBULATORY_CARE_PROVIDER_SITE_OTHER): Payer: Medicaid Other | Admitting: Obstetrics and Gynecology

## 2012-04-20 ENCOUNTER — Encounter: Payer: Self-pay | Admitting: Obstetrics and Gynecology

## 2012-04-20 NOTE — Progress Notes (Signed)
Date of delivery: 03/09/2012 Female Name: Brandi Castillo Vaginal delivery:yes Cesarean section:no Tubal ligation:no GDM:no Breast Feeding:yes Bottle Feeding:yes Post-Partum Blues:no Abnormal pap:no Normal GU function: yes Normal GI function:yes Returning to work:yes Aug 2013  Pt wants Nexplanon  EPDS 6  Pt had unprotected IC during 6 wk pp

## 2012-04-20 NOTE — Progress Notes (Signed)
S: comfortable      Bleeding stopped no menses     Breastfeeding, had intercourse 4 days ago O VSS     abd soft, nontender     Diastasis recti 2 finger breath     Normal hair distrubition mons pubis,      EGBUS and perineum WNL,  good vaginal tone cerix LTC, no cervical motion tenderness, No adnexal masses or tenderness uterus firm small Discharge wnl     A normal involution     Lactating     6 weeks PP P pg test then Nexplanon, discussed nexplanon risks benefits and procedure, encouraged condom use until insertion, reviewed exercise. Lavera Guise, CNM

## 2012-05-10 ENCOUNTER — Encounter: Payer: Medicaid Other | Admitting: Obstetrics and Gynecology

## 2012-10-03 ENCOUNTER — Ambulatory Visit (INDEPENDENT_AMBULATORY_CARE_PROVIDER_SITE_OTHER): Payer: Managed Care, Other (non HMO) | Admitting: Internal Medicine

## 2012-10-03 VITALS — BP 110/64 | HR 92 | Temp 98.5°F | Resp 16 | Ht 59.5 in | Wt 132.6 lb

## 2012-10-03 DIAGNOSIS — Z202 Contact with and (suspected) exposure to infections with a predominantly sexual mode of transmission: Secondary | ICD-10-CM

## 2012-10-03 DIAGNOSIS — Z2089 Contact with and (suspected) exposure to other communicable diseases: Secondary | ICD-10-CM

## 2012-10-03 DIAGNOSIS — N390 Urinary tract infection, site not specified: Secondary | ICD-10-CM

## 2012-10-03 DIAGNOSIS — R350 Frequency of micturition: Secondary | ICD-10-CM

## 2012-10-03 LAB — POCT URINALYSIS DIPSTICK
Bilirubin, UA: NEGATIVE
Glucose, UA: NEGATIVE
Ketones, UA: NEGATIVE
Nitrite, UA: NEGATIVE
Protein, UA: 30
Spec Grav, UA: 1.025
Urobilinogen, UA: 0.2
pH, UA: 6

## 2012-10-03 LAB — POCT UA - MICROSCOPIC ONLY
Casts, Ur, LPF, POC: NEGATIVE
Crystals, Ur, HPF, POC: NEGATIVE
Mucus, UA: NEGATIVE
Yeast, UA: NEGATIVE

## 2012-10-03 MED ORDER — CIPROFLOXACIN HCL 250 MG PO TABS: 250.0000 mg | ORAL_TABLET | Freq: Two times a day (BID) | ORAL | Status: DC

## 2012-10-03 NOTE — Progress Notes (Signed)
  Subjective:    Patient ID: Brandi Castillo, female    DOB: February 16, 1991, 21 y.o.   MRN: [id number]  CC: 21 yo F c/o dysuria   HPI Pt c/o 2 wk hx of urinary symptoms.  She has dysuria, odor, hesitancy, feelings of incomplete voiding, and recent itching.  She denies changes in color of her urine, n.v.d, and abdominal/suprapubic pain.  She recently visited her boyfriend/father of her baby in [place] and her symptoms started after the visit.  She says she is in a monogamous relationship and has only had 1 partner in past 12 mths and that she uses condoms.  However, she c/o itching associated w/ latex condom use that resolved w/in minutes after the exposure.     Review of Systems Noncontributory    Objective:   Physical Exam General: 21 yo F appears cold and draped in blankets, she is pleasant and cooperative w/ exam. Vitals:  Filed Vitals:   10/03/12 1633  BP: 110/64  Pulse: 92  Temp: 98.5 F (36.9 C)  Resp: 16  HEENT: Nontraumatic, EOMIT, Normal to external exam, trachea midline Heart: Regular rate Lungs: NAD Abdomen: Flat, active bs, nt to palption w/o rebound or masses Pelvic: nt to palp in suprapubic region MSK: Normal bulk and tone Neuro: Alert, oriented, CN II - XII grossly IT  Results for orders placed in visit on 10/03/12  POCT UA - MICROSCOPIC ONLY      Component Value Range   WBC, Ur, HPF, POC tntc     RBC, urine, microscopic tntc     Bacteria, U Microscopic trace     Mucus, UA neg     Epithelial cells, urine per micros 0-2     Crystals, Ur, HPF, POC neg     Casts, Ur, LPF, POC neg     Yeast, UA neg    POCT URINALYSIS DIPSTICK      Component Value Range   Color, UA yellow     Clarity, UA cloudy     Glucose, UA neg     Bilirubin, UA neg     Ketones, UA neg     Spec Grav, UA 1.025     Blood, UA mod     pH, UA 6.0     Protein, UA 30     Urobilinogen, UA 0.2     Nitrite, UA neg     Leukocytes, UA moderate (2+)          Assessment & Plan:   1. Urinary  frequency  POCT UA - Microscopic Only, POCT urinalysis dipstick  2. Exposure to STD  GC/chlamydia probe amp, genital, GC/chlamydia probe amp, urine  3. Urinary tract infection, site not specified  Urine culture    uriprobe,UC Meds ordered this encounter  Medications  . ciprofloxacin (CIPRO) 250 MG tablet    Sig: Take 1 tablet (250 mg total) by mouth 2 (two) times daily.    Dispense:  20 tablet    Refill:  0

## 2012-10-03 NOTE — Patient Instructions (Addendum)
Tests on urine are still pending and we will call you Recheck 10 days if not well!!!

## 2012-10-04 LAB — GC/CHLAMYDIA PROBE AMP, URINE
Chlamydia, Swab/Urine, PCR: NEGATIVE
GC Probe Amp, Urine: NEGATIVE

## 2012-10-05 LAB — URINE CULTURE

## 2012-10-07 ENCOUNTER — Encounter: Payer: Self-pay | Admitting: Internal Medicine

## 2014-04-02 ENCOUNTER — Emergency Department (HOSPITAL_BASED_OUTPATIENT_CLINIC_OR_DEPARTMENT_OTHER): Payer: Managed Care, Other (non HMO)

## 2014-04-02 ENCOUNTER — Encounter (HOSPITAL_BASED_OUTPATIENT_CLINIC_OR_DEPARTMENT_OTHER): Payer: Self-pay | Admitting: Emergency Medicine

## 2014-04-02 ENCOUNTER — Emergency Department (HOSPITAL_BASED_OUTPATIENT_CLINIC_OR_DEPARTMENT_OTHER)
Admission: EM | Admit: 2014-04-02 | Discharge: 2014-04-03 | Disposition: A | Discharge status: Home / Self Care | Payer: Managed Care, Other (non HMO) | Attending: Emergency Medicine | Admitting: Emergency Medicine

## 2014-04-02 DIAGNOSIS — R52 Pain, unspecified: Secondary | ICD-10-CM | POA: Insufficient documentation

## 2014-04-02 DIAGNOSIS — R079 Chest pain, unspecified: Secondary | ICD-10-CM | POA: Insufficient documentation

## 2014-04-02 DIAGNOSIS — Z8619 Personal history of other infectious and parasitic diseases: Secondary | ICD-10-CM | POA: Insufficient documentation

## 2014-04-02 DIAGNOSIS — Z79899 Other long term (current) drug therapy: Secondary | ICD-10-CM | POA: Insufficient documentation

## 2014-04-02 DIAGNOSIS — Z792 Long term (current) use of antibiotics: Secondary | ICD-10-CM | POA: Insufficient documentation

## 2014-04-02 DIAGNOSIS — Z8659 Personal history of other mental and behavioral disorders: Secondary | ICD-10-CM | POA: Insufficient documentation

## 2014-04-02 DIAGNOSIS — J029 Acute pharyngitis, unspecified: Secondary | ICD-10-CM | POA: Insufficient documentation

## 2014-04-02 LAB — RAPID STREP SCREEN (MED CTR MEBANE ONLY): Streptococcus, Group A Screen (Direct): NEGATIVE

## 2014-04-02 MED ORDER — PENICILLIN G BENZATHINE 1200000 UNIT/2ML IM SUSP
1.2000 10*6.[IU] | Freq: Once | INTRAMUSCULAR | Status: AC
  Administered 2014-04-02: 1.2 10*6.[IU] via INTRAMUSCULAR
  Filled 2014-04-02: qty 2

## 2014-04-02 NOTE — ED Notes (Signed)
Pt reports sore throat x 1 week, was seen by pcp last week dx with allergies, but pain has increased since then

## 2014-04-02 NOTE — ED Notes (Signed)
Sore throat x 1 week  States it is getting worse, has had cough productive, green, and runny nose Has been seen for same

## 2014-04-02 NOTE — ED Provider Notes (Signed)
CSN: [id number]     Arrival date & time 04/02/14  2107 History  This chart was scribed for Ezequiel Essex, MD by Elby Beck, ED Scribe. This patient was seen in room MH03/MH03 and the patient's care was started at 11:35 PM.  Chief Complaint  Patient presents with  . Sore Throat    The history is provided by the patient. No language interpreter was used.    HPI Comments: Brandi Castillo is a 23 y.o. Female with a history of anxiety but no other chronic medial conditions who presents to the Emergency Department complaining of a constant, moderate sore throat for the past week. She states that her pain is equally present on both sides of her throat. She states that her pain is worsened with swallowing. She states that there are no alleviating factors for her pain. She has tried Ibuprofen and Tylenol earlier this week without relief. She states that she has had recent sick contacts with her daughter who has had a sinus infection and conjunctivitis. She states that she has been having allergy symptoms, and a cough productive of green sputum recently. She also reports associated intermittent, mild chest pain onset today. She has a birth control implant. She states that there is no chance she could be pregnant. She denies headache, fever, SOB, abdominal pain, emesis, dental pain or any other pain or symptoms. She denies any history of smoking. She states that her only medication allergy is to Sulfa drugs.  PCP- None   Past Medical History  Diagnosis Date  . Anxiety   . Yeast infection   . Bacterial infection   . H/O candidiasis   . H/O varicella    History reviewed. No pertinent past surgical history. Family History  Problem Relation Age of Onset  . Asthma Mother   . Depression Mother   . Alcohol abuse Mother   . Alcohol abuse Father   . Asthma Sister   . Asthma Brother   . Diabetes Maternal Grandmother   . Alcohol abuse Maternal Grandmother   . Hypertension Maternal Grandfather    . Alcohol abuse Maternal Grandfather   . Alcohol abuse Paternal Grandmother   . Diabetes Paternal Grandfather   . Alcohol abuse Paternal Grandfather    History  Substance Use Topics  . Smoking status: Never Smoker   . Smokeless tobacco: Never Used  . Alcohol Use: No   OB History   Grav Para Term Preterm Abortions TAB SAB Ect Mult Living   1 1 1  0 0 0 0 0 0 1     Review of Systems A complete 10 system review of systems was obtained and all systems are negative except as noted in the HPI and PMH.   Allergies  Sulfa drugs cross reactors  Home Medications   Prior to Admission medications   Medication Sig Start Date End Date Taking? Authorizing Provider  ciprofloxacin (CIPRO) 250 MG tablet Take 1 tablet (250 mg total) by mouth 2 (two) times daily. 10/03/12   Leandrew Koyanagi, MD  Ibuprofen (MOTRIN PO) Take by mouth as needed.    Historical Provider, MD  Prenatal Vit-Fe Fumarate-FA (PRENATAL MULTIVITAMIN) TABS Take 1 tablet by mouth daily.      Historical Provider, MD   Triage Vitals: BP 129/83  Pulse 88  Temp(Src) 99.1 F (37.3 C) (Oral)  Resp 18  Ht 4\' 11"  (1.499 m)  Wt 145 lb (65.772 kg)  BMI 29.27 kg/m2  SpO2 100%  LMP 04/02/2014  Physical Exam  Nursing  note and vitals reviewed. Constitutional: She is oriented to person, place, and time. She appears well-developed and well-nourished. No distress.  HENT:  Head: Normocephalic and atraumatic.  Mouth/Throat: Oropharyngeal exudate present.  Tonsils are symmetrical. Scant exudate. Uvula midline. No meningismus.  Eyes: Conjunctivae and EOM are normal. Pupils are equal, round, and reactive to light.  Neck: Neck supple. No tracheal deviation present.  No meningismus  Cardiovascular: Normal rate, regular rhythm and normal heart sounds.   No murmur heard. Pulmonary/Chest: Effort normal. No respiratory distress. She has no wheezes.  Abdominal: Soft. There is no tenderness. There is no rebound and no guarding.   Genitourinary: Guaiac negative stool. No vaginal discharge found.  Musculoskeletal: Normal range of motion. She exhibits no edema and no tenderness.  Lymphadenopathy:    She has cervical adenopathy.  Neurological: She is alert and oriented to person, place, and time. No cranial nerve deficit. She exhibits normal muscle tone. Coordination normal.  Skin: Skin is warm and dry.  Psychiatric: She has a normal mood and affect. Her behavior is normal.    ED Course  Procedures (including critical care time)  DIAGNOSTIC STUDIES: Oxygen Saturation is 100% on RA, normal by my interpretation.    COORDINATION OF CARE: 11:40 PM- Discussed plan to obtain a Strep screen and a CXR. Will also order Bicillin and have pt undergo a fluid trial. Pt advised of plan for treatment and pt agrees.  Medications  penicillin g benzathine (BICILLIN LA) 1200000 UNIT/2ML injection 1.2 Million Units (1.2 Million Units Intramuscular Given 04/02/14 2344)   Labs Review Labs Reviewed  RAPID STREP SCREEN  CULTURE, GROUP A STREP   Imaging Review Dg Chest 2 View  04/03/2014   CLINICAL DATA:  Sore throat for 1 week  EXAM: CHEST  2 VIEW  COMPARISON:  None.  FINDINGS: The heart size and mediastinal contours are within normal limits. Both lungs are clear. The visualized skeletal structures are unremarkable.  IMPRESSION: No active cardiopulmonary disease.   Electronically Signed   By: Kathreen Devoid   On: 04/03/2014 00:16     EKG Interpretation None      MDM   Final diagnoses:  Pharyngitis   1 week history of sore throat with body aches and chills. Nonproductive cough. No fevers. No hypoxia or tachycardia.   Tonsils symmetric, scant exudate, uvula midline. +LAD. Will treat with bicillin.   Tolerating PO.  Nontoxic appearing.  Follow up with PCP, return precautions discussed.   I personally performed the services described in this documentation, which was scribed in my presence. The recorded information has been  reviewed and is accurate.   Ezequiel Essex, MD 04/03/14 [phone]

## 2014-04-03 NOTE — Discharge Instructions (Signed)
Pharyngitis °Pharyngitis is redness, pain, and swelling (inflammation) of your pharynx.  °CAUSES  °Pharyngitis is usually caused by infection. Most of the time, these infections are from viruses (viral) and are part of a cold. However, sometimes pharyngitis is caused by bacteria (bacterial). Pharyngitis can also be caused by allergies. Viral pharyngitis may be spread from person to person by coughing, sneezing, and personal items or utensils (cups, forks, spoons, toothbrushes). Bacterial pharyngitis may be spread from person to person by more intimate contact, such as kissing.  °SIGNS AND SYMPTOMS  °Symptoms of pharyngitis include:   °· Sore throat.   °· Tiredness (fatigue).   °· Low-grade fever.   °· Headache. °· Joint pain and muscle aches. °· Skin rashes. °· Swollen lymph nodes. °· Plaque-like film on throat or tonsils (often seen with bacterial pharyngitis). °DIAGNOSIS  °Your health care provider will ask you questions about your illness and your symptoms. Your medical history, along with a physical exam, is often all that is needed to diagnose pharyngitis. Sometimes, a rapid strep test is done. Other lab tests may also be done, depending on the suspected cause.  °TREATMENT  °Viral pharyngitis will usually get better in 3 4 days without the use of medicine. Bacterial pharyngitis is treated with medicines that kill germs (antibiotics).  °HOME CARE INSTRUCTIONS  °· Drink enough water and fluids to keep your urine clear or pale yellow.   °· Only take over-the-counter or prescription medicines as directed by your health care provider:   °· If you are prescribed antibiotics, make sure you finish them even if you start to feel better.   °· Do not take aspirin.   °· Get lots of rest.   °· Gargle with 8 oz of salt water (½ tsp of salt per 1 qt of water) as often as every 1 2 hours to soothe your throat.   °· Throat lozenges (if you are not at risk for choking) or sprays may be used to soothe your throat. °SEEK MEDICAL  CARE IF:  °· You have large, tender lumps in your neck. °· You have a rash. °· You cough up green, yellow-brown, or bloody spit. °SEEK IMMEDIATE MEDICAL CARE IF:  °· Your neck becomes stiff. °· You drool or are unable to swallow liquids. °· You vomit or are unable to keep medicines or liquids down. °· You have severe pain that does not go away with the use of recommended medicines. °· You have trouble breathing (not caused by a stuffy nose). °MAKE SURE YOU:  °· Understand these instructions. °· Will watch your condition. °· Will get help right away if you are not doing well or get worse. °Document Released: 11/16/2005 Document Revised: 09/06/2013 Document Reviewed: 07/24/2013 °[employer]® Patient Information ©2014 [employer]. ° °

## 2014-04-04 LAB — CULTURE, GROUP A STREP

## 2014-05-17 ENCOUNTER — Other Ambulatory Visit: Payer: Self-pay | Admitting: Obstetrics and Gynecology

## 2014-05-17 DIAGNOSIS — N644 Mastodynia: Secondary | ICD-10-CM

## 2014-05-21 ENCOUNTER — Other Ambulatory Visit: Payer: Managed Care, Other (non HMO)

## 2014-05-23 ENCOUNTER — Ambulatory Visit
Admission: RE | Admit: 2014-05-23 | Discharge: 2014-05-23 | Disposition: A | Discharge status: Home / Self Care | Payer: Managed Care, Other (non HMO) | Source: Ambulatory Visit | Attending: Obstetrics and Gynecology | Admitting: Obstetrics and Gynecology

## 2014-05-23 DIAGNOSIS — N644 Mastodynia: Secondary | ICD-10-CM

## 2014-10-01 ENCOUNTER — Encounter (HOSPITAL_BASED_OUTPATIENT_CLINIC_OR_DEPARTMENT_OTHER): Payer: Self-pay | Admitting: Emergency Medicine

## 2015-05-24 ENCOUNTER — Ambulatory Visit (INDEPENDENT_AMBULATORY_CARE_PROVIDER_SITE_OTHER): Payer: Managed Care, Other (non HMO) | Admitting: Family Medicine

## 2015-05-24 VITALS — BP 108/70 | HR 78 | Temp 98.2°F | Resp 17 | Ht 59.0 in | Wt 144.6 lb

## 2015-05-24 DIAGNOSIS — N760 Acute vaginitis: Secondary | ICD-10-CM

## 2015-05-24 DIAGNOSIS — N898 Other specified noninflammatory disorders of vagina: Secondary | ICD-10-CM | POA: Diagnosis not present

## 2015-05-24 DIAGNOSIS — A499 Bacterial infection, unspecified: Secondary | ICD-10-CM

## 2015-05-24 DIAGNOSIS — Z7251 High risk heterosexual behavior: Secondary | ICD-10-CM

## 2015-05-24 DIAGNOSIS — F418 Other specified anxiety disorders: Secondary | ICD-10-CM

## 2015-05-24 DIAGNOSIS — B9689 Other specified bacterial agents as the cause of diseases classified elsewhere: Secondary | ICD-10-CM

## 2015-05-24 LAB — POCT WET PREP WITH KOH
Clue Cells Wet Prep HPF POC: 30
KOH Prep POC: NEGATIVE
Trichomonas, UA: NEGATIVE
Yeast Wet Prep HPF POC: NEGATIVE

## 2015-05-24 MED ORDER — METRONIDAZOLE 500 MG PO TABS: ORAL_TABLET | ORAL | Status: DC

## 2015-05-24 MED ORDER — FLUOXETINE HCL 20 MG PO TABS: 20.0000 mg | ORAL_TABLET | Freq: Every day | ORAL | Status: DC

## 2015-05-24 MED ORDER — CLONAZEPAM 0.5 MG PO TABS: 0.5000 mg | ORAL_TABLET | Freq: Two times a day (BID) | ORAL | Status: DC | PRN

## 2015-05-24 NOTE — Patient Instructions (Addendum)
Call one of the counselors below to arrange counseling. Let me know if other names or numbers needed.  Brandi Castillo: [phone] Arvil Chaco: [phone]  Can start prozac once per day., klonopin if needed for anxiety attack, follow up in next 3-4 weeks to discuss anxiety further.   If you have an increase or worsening of your symptoms including any worsening chest pain - return here or emergency room.  Return to the clinic or go to the nearest emergency room if any of your symptoms worsen or new symptoms occur.  Condoms every time with intercourse. You should receive a call or letter about your lab results within the next week to 10 days.   Start Flagyl for bacterial vaginosis - see more info below.  Bacterial Vaginosis Bacterial vaginosis is a vaginal infection that occurs when the normal balance of bacteria in the vagina is disrupted. It results from an overgrowth of certain bacteria. This is the most common vaginal infection in women of childbearing age. Treatment is important to prevent complications, especially in pregnant women, as it can cause a premature delivery. CAUSES  Bacterial vaginosis is caused by an increase in harmful bacteria that are normally present in smaller amounts in the vagina. Several different kinds of bacteria can cause bacterial vaginosis. However, the reason that the condition develops is not fully understood. RISK FACTORS Certain activities or behaviors can put you at an increased risk of developing bacterial vaginosis, including:  Having a new sex partner or multiple sex partners.  Douching.  Using an intrauterine device (IUD) for contraception. Women do not get bacterial vaginosis from toilet seats, bedding, swimming pools, or contact with objects around them. SIGNS AND SYMPTOMS  Some women with bacterial vaginosis have no signs or symptoms. Common symptoms include:  Grey vaginal discharge.  A fishlike odor with discharge, especially after sexual  intercourse.  Itching or burning of the vagina and vulva.  Burning or pain with urination. DIAGNOSIS  Your health care provider will take a medical history and examine the vagina for signs of bacterial vaginosis. A sample of vaginal fluid may be taken. Your health care provider will look at this sample under a microscope to check for bacteria and abnormal cells. A vaginal pH test may also be done.  TREATMENT  Bacterial vaginosis may be treated with antibiotic medicines. These may be given in the form of a pill or a vaginal cream. A second round of antibiotics may be prescribed if the condition comes back after treatment.  HOME CARE INSTRUCTIONS   Only take over-the-counter or prescription medicines as directed by your health care provider.  If antibiotic medicine was prescribed, take it as directed. Make sure you finish it even if you start to feel better.  Do not have sex until treatment is completed.  Tell all sexual partners that you have a vaginal infection. They should see their health care provider and be treated if they have problems, such as a mild rash or itching.  Practice safe sex by using condoms and only having one sex partner. SEEK MEDICAL CARE IF:   Your symptoms are not improving after 3 days of treatment.  You have increased discharge or pain.  You have a fever. MAKE SURE YOU:   Understand these instructions.  Will watch your condition.  Will get help right away if you are not doing well or get worse. FOR MORE INFORMATION  Centers for Disease Control and Prevention, Division of STD Prevention: AppraiserFraud.fi American Sexual Health Association (ASHA): www.ashastd.org  Document Released: 11/16/2005 Document Revised: 09/06/2013 Document Reviewed: 06/28/2013 [facility] Patient Information 2015 [place], [place]. This information is not intended to replace advice given to you by your health care provider. Make sure you discuss any questions you have with your health  care provider.  Depression Depression refers to feeling sad, low, down in the dumps, blue, gloomy, or empty. In general, there are two kinds of depression:  Normal sadness or normal grief. This kind of depression is one that we all feel from time to time after upsetting life experiences, such as the loss of a job or the ending of a relationship. This kind of depression is considered normal, is short lived, and resolves within a few days to 2 weeks. Depression experienced after the loss of a loved one (bereavement) often lasts longer than 2 weeks but normally gets better with time.  Clinical depression. This kind of depression lasts longer than normal sadness or normal grief or interferes with your ability to function at home, at work, and in school. It also interferes with your personal relationships. It affects almost every aspect of your life. Clinical depression is an illness. Symptoms of depression can also be caused by conditions other than those mentioned above, such as:  Physical illness. Some physical illnesses, including underactive thyroid gland (hypothyroidism), severe anemia, specific types of cancer, diabetes, uncontrolled seizures, heart and lung problems, strokes, and chronic pain are commonly associated with symptoms of depression.  Side effects of some prescription medicine. In some people, certain types of medicine can cause symptoms of depression.  Substance abuse. Abuse of alcohol and illicit drugs can cause symptoms of depression. SYMPTOMS Symptoms of normal sadness and normal grief include the following:  Feeling sad or crying for short periods of time.  Not caring about anything (apathy).  Difficulty sleeping or sleeping too much.  No longer able to enjoy the things you used to enjoy.  Desire to be by oneself all the time (social isolation).  Lack of energy or motivation.  Difficulty concentrating or remembering.  Change in appetite or weight.  Restlessness or  agitation. Symptoms of clinical depression include the same symptoms of normal sadness or normal grief and also the following symptoms:  Feeling sad or crying all the time.  Feelings of guilt or worthlessness.  Feelings of hopelessness or helplessness.  Thoughts of suicide or the desire to harm yourself (suicidal ideation).  Loss of touch with reality (psychotic symptoms). Seeing or hearing things that are not real (hallucinations) or having false beliefs about your life or the people around you (delusions and paranoia). DIAGNOSIS  The diagnosis of clinical depression is usually based on how bad the symptoms are and how long they have lasted. Your health care provider will also ask you questions about your medical history and substance use to find out if physical illness, use of prescription medicine, or substance abuse is causing your depression. Your health care provider may also order blood tests. TREATMENT  Often, normal sadness and normal grief do not require treatment. However, sometimes antidepressant medicine is given for bereavement to ease the depressive symptoms until they resolve. The treatment for clinical depression depends on how bad the symptoms are but often includes antidepressant medicine, counseling with a mental health professional, or both. Your health care provider will help to determine what treatment is best for you. Depression caused by physical illness usually goes away with appropriate medical treatment of the illness. If prescription medicine is causing depression, talk with your health care provider  about stopping the medicine, decreasing the dose, or changing to another medicine. Depression caused by the abuse of alcohol or illicit drugs goes away when you stop using these substances. Some adults need professional help in order to stop drinking or using drugs. SEEK IMMEDIATE MEDICAL CARE IF:  You have thoughts about hurting yourself or others.  You lose touch  with reality (have psychotic symptoms).  You are taking medicine for depression and have a serious side effect. FOR MORE INFORMATION  National Alliance on Mental Illness: www.nami.[employer] of Mental [employer]: https://carter.com/ Document Released: 11/13/2000 Document Revised: 04/02/2014 Document Reviewed: 02/15/2012 [facility] Patient Information 2015 [place], [place]. This information is not intended to replace advice given to you by your health care provider. Make sure you discuss any questions you have with your health care provider.   Generalized Anxiety Disorder Generalized anxiety disorder (GAD) is a mental disorder. It interferes with life functions, including relationships, work, and school. GAD is different from normal anxiety, which everyone experiences at some point in their lives in response to specific life events and activities. Normal anxiety actually helps [place] prepare for and get through these life events and activities. Normal anxiety goes away after the event or activity is over.  GAD causes anxiety that is not necessarily related to specific events or activities. It also causes excess anxiety in proportion to specific events or activities. The anxiety associated with GAD is also difficult to control. GAD can vary from mild to severe. People with severe GAD can have intense waves of anxiety with physical symptoms (panic attacks).  SYMPTOMS The anxiety and worry associated with GAD are difficult to control. This anxiety and worry are related to many life events and activities and also occur more days than not for 6 months or longer. People with GAD also have three or more of the following symptoms (one or more in children):  Restlessness.   Fatigue.  Difficulty concentrating.   Irritability.  Muscle tension.  Difficulty sleeping or unsatisfying sleep. DIAGNOSIS GAD is diagnosed through an assessment by your health care provider. Your health care provider will  ask you questions aboutyour mood,physical symptoms, and events in your life. Your health care provider may ask you about your medical history and use of alcohol or drugs, including prescription medicines. Your health care provider may also do a physical exam and blood tests. Certain medical conditions and the use of certain substances can cause symptoms similar to those associated with GAD. Your health care provider may refer you to a mental health specialist for further evaluation. TREATMENT The following therapies are usually used to treat GAD:   Medication. Antidepressant medication usually is prescribed for long-term daily control. Antianxiety medicines may be added in severe cases, especially when panic attacks occur.   Talk therapy (psychotherapy). Certain types of talk therapy can be helpful in treating GAD by providing support, education, and guidance. A form of talk therapy called cognitive behavioral therapy can teach you healthy ways to think about and react to daily life events and activities.  Stress managementtechniques. These include yoga, meditation, and exercise and can be very helpful when they are practiced regularly. A mental health specialist can help determine which treatment is best for you. Some people see improvement with one therapy. However, other people require a combination of therapies. Document Released: 03/13/2013 Document Revised: 04/02/2014 Document Reviewed: 03/13/2013 [facility] Patient Information 2015 [place], [place]. This information is not intended to replace advice given to you by your health care provider.  Make sure you discuss any questions you have with your health care provider.  Stress and Stress Management Stress is a normal reaction to life events. It is what you feel when life demands more than you are used to or more than you can handle. Some stress can be useful. For example, the stress reaction can help you catch the last bus of the day, study for a  test, or meet a deadline at work. But stress that occurs too often or for too long can cause problems. It can affect your emotional health and interfere with relationships and normal daily activities. Too much stress can weaken your immune system and increase your risk for physical illness. If you already have a medical problem, stress can make it worse. CAUSES  All sorts of life events may cause stress. An event that causes stress for one person may not be stressful for another person. Major life events commonly cause stress. These may be positive or negative. Examples include losing your job, moving into a new home, getting married, having a baby, or losing a loved one. Less obvious life events may also cause stress, especially if they occur day after day or in combination. Examples include working long hours, driving in traffic, caring for children, being in debt, or being in a difficult relationship. SIGNS AND SYMPTOMS Stress may cause emotional symptoms including, the following:  Anxiety. This is feeling worried, afraid, on edge, overwhelmed, or out of control.  Anger. This is feeling irritated or impatient.  Depression. This is feeling sad, down, helpless, or guilty.  Difficulty focusing, remembering, or making decisions. Stress may cause physical symptoms, including the following:   Aches and pains. These may affect your head, neck, back, stomach, or other areas of your body.  Tight muscles or clenched jaw.  Low energy or trouble sleeping. Stress may cause unhealthy behaviors, including the following:   Eating to feel better (overeating) or skipping meals.  Sleeping too little, too much, or both.  Working too much or putting off tasks (procrastination).  Smoking, drinking alcohol, or using drugs to feel better. DIAGNOSIS  Stress is diagnosed through an assessment by your health care provider. Your health care provider will ask questions about your symptoms and any stressful life  events.Your health care provider will also ask about your medical history and may order blood tests or other tests. Certain medical conditions and medicine can cause physical symptoms similar to stress. Mental illness can cause emotional symptoms and unhealthy behaviors similar to stress. Your health care provider may refer you to a mental health professional for further evaluation.  TREATMENT  Stress management is the recommended treatment for stress.The goals of stress management are reducing stressful life events and coping with stress in healthy ways.  Techniques for reducing stressful life events include the following:  Stress identification. Self-monitor for stress and identify what causes stress for you. These skills may help you to avoid some stressful events.  Time management. Set your priorities, keep a calendar of events, and learn to say "no." These tools can help you avoid making too many commitments. Techniques for coping with stress include the following:  Rethinking the problem. Try to think realistically about stressful events rather than ignoring them or overreacting. Try to find the positives in a stressful situation rather than focusing on the negatives.  Exercise. Physical exercise can release both physical and emotional tension. The key is to find a form of exercise you enjoy and do it regularly.  Relaxation  techniques. These relax the body and mind. Examples include yoga, meditation, tai chi, biofeedback, deep breathing, progressive muscle relaxation, listening to music, being out in nature, journaling, and other hobbies. Again, the key is to find one or more that you enjoy and can do regularly.  Healthy lifestyle. Eat a balanced diet, get plenty of sleep, and do not smoke. Avoid using alcohol or drugs to relax.  Strong support network. Spend time with family, friends, or other people you enjoy being around.Express your feelings and talk things over with someone you  trust. Counseling or talktherapy with a mental health professional may be helpful if you are having difficulty managing stress on your own. Medicine is typically not recommended for the treatment of stress.Talk to your health care provider if you think you need medicine for symptoms of stress. HOME CARE INSTRUCTIONS  Keep all follow-up visits as directed by your health care provider.  Take all medicines as directed by your health care provider. SEEK MEDICAL CARE IF:  Your symptoms get worse or you start having new symptoms.  You feel overwhelmed by your problems and can no longer manage them on your own. SEEK IMMEDIATE MEDICAL CARE IF:  You feel like hurting yourself or someone else. Document Released: 05/12/2001 Document Revised: 04/02/2014 Document Reviewed: 07/11/2013 [facility] Patient Information 2015 [place], [place]. This information is not intended to replace advice given to you by your health care provider. Make sure you discuss any questions you have with your health care provider.
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
## 2015-05-25 LAB — RPR

## 2015-05-25 LAB — HIV ANTIBODY (ROUTINE TESTING W REFLEX): HIV 1&2 Ab, 4th Generation: NONREACTIVE

## 2015-05-25 LAB — GC/CHLAMYDIA PROBE AMP
CT Probe RNA: NEGATIVE
GC Probe RNA: NEGATIVE

## 2015-05-25 LAB — TSH: TSH: 1.522 u[IU]/mL (ref 0.350–4.500)

## 2015-08-08 ENCOUNTER — Ambulatory Visit (INDEPENDENT_AMBULATORY_CARE_PROVIDER_SITE_OTHER): Payer: Managed Care, Other (non HMO) | Admitting: Family Medicine

## 2015-08-08 VITALS — BP 118/70 | HR 90 | Temp 98.6°F | Resp 16 | Ht 60.0 in | Wt 145.0 lb

## 2015-08-08 DIAGNOSIS — J069 Acute upper respiratory infection, unspecified: Secondary | ICD-10-CM | POA: Diagnosis not present

## 2015-08-08 DIAGNOSIS — H6982 Other specified disorders of Eustachian tube, left ear: Secondary | ICD-10-CM | POA: Diagnosis not present

## 2015-08-08 DIAGNOSIS — N760 Acute vaginitis: Secondary | ICD-10-CM

## 2015-08-08 DIAGNOSIS — A499 Bacterial infection, unspecified: Secondary | ICD-10-CM

## 2015-08-08 DIAGNOSIS — B9689 Other specified bacterial agents as the cause of diseases classified elsewhere: Secondary | ICD-10-CM

## 2015-08-08 MED ORDER — FLUTICASONE PROPIONATE 50 MCG/ACT NA SUSP: NASAL | Status: DC

## 2015-08-08 MED ORDER — METRONIDAZOLE 500 MG PO TABS: ORAL_TABLET | ORAL | Status: DC

## 2015-08-08 NOTE — Progress Notes (Signed)
Upper respiratory illness Subjective:  Patient ID: Brandi Castillo, female    DOB: September 13, 1991  Age: 24 y.o. MRN: [id number]  Patient is here with a couple day history of upper respiratory congestion and ear stuffiness. She says it is been going through the household. She felt worse today and came on in to get checked. She's had more sore throat today. She is coughing some today. She missed half a day of work today  She's been noticing the odor of bacterial vaginosis which she was treated for about 2 months ago. She would like to be retreated for that if possible.   Objective:   No major distress. TMs normal and pearly white. Her throat was clear, not particular erythematous. Neck supple with tiny nodes. Chest clear. Heart regular without murmurs. Pelvic not done.  Assessment & Plan:   Assessment:  Upper respiratory infection Bacterial vaginosis recurrently  Plan:  Will treat the BV, but if she gets more symptoms she will need another pelvic exam. She can take some OTC Robitussin-DM.  Patient Instructions  Drink plenty of fluids and get enough rest  Take the metronidazole twice daily for the bacterial vaginosis. If symptoms persist will need to do a pelvic exam. Do not drink any alcohol while on this medication or you will vomit  Take an over-the-counter anti-histamine decongested such as Claritin-D or Allegra-D or Zyrtec-D one daily (loratadine D, fexofenadine D, or cetirizine D)  Use the fluticasone (Flonase) nose spray 2 sprays each nostril twice daily for 3 days, then once daily  Practice swallowing candy so he can learn to swallow pills.  Return if worse or Bacterial Vaginosis Bacterial vaginosis is a vaginal infection that occurs when the normal balance of bacteria in the vagina is disrupted. It results from an overgrowth of certain bacteria. This is the most common vaginal infection in women of childbearing age. Treatment is important to prevent complications, especially  in pregnant women, as it can cause a premature delivery. CAUSES  Bacterial vaginosis is caused by an increase in harmful bacteria that are normally present in smaller amounts in the vagina. Several different kinds of bacteria can cause bacterial vaginosis. However, the reason that the condition develops is not fully understood. RISK FACTORS Certain activities or behaviors can put you at an increased risk of developing bacterial vaginosis, including:  Having a new sex partner or multiple sex partners.  Douching.  Using an intrauterine device (IUD) for contraception. Women do not get bacterial vaginosis from toilet seats, bedding, swimming pools, or contact with objects around them. SIGNS AND SYMPTOMS  Some women with bacterial vaginosis have no signs or symptoms. Common symptoms include:  Grey vaginal discharge.  A fishlike odor with discharge, especially after sexual intercourse.  Itching or burning of the vagina and vulva.  Burning or pain with urination. DIAGNOSIS  Your health care provider will take a medical history and examine the vagina for signs of bacterial vaginosis. A sample of vaginal fluid may be taken. Your health care provider will look at this sample under a microscope to check for bacteria and abnormal cells. A vaginal pH test may also be done.  TREATMENT  Bacterial vaginosis may be treated with antibiotic medicines. These may be given in the form of a pill or a vaginal cream. A second round of antibiotics may be prescribed if the condition comes back after treatment.  HOME CARE INSTRUCTIONS   Only take over-the-counter or prescription medicines as directed by your health care provider.  If antibiotic medicine  was prescribed, take it as directed. Make sure you finish it even if you start to feel better.  Do not have sex until treatment is completed.  Tell all sexual partners that you have a vaginal infection. They should see their health care provider and be treated  if they have problems, such as a mild rash or itching.  Practice safe sex by using condoms and only having one sex partner. SEEK MEDICAL CARE IF:   Your symptoms are not improving after 3 days of treatment.  You have increased discharge or pain.  You have a fever. MAKE SURE YOU:   Understand these instructions.  Will watch your condition.  Will get help right away if you are not doing well or get worse. FOR MORE INFORMATION  Centers for Disease Control and Prevention, Division of STD Prevention: AppraiserFraud.fi American Sexual Health Association (ASHA): www.ashastd.org  Document Released: 11/16/2005 Document Revised: 09/06/2013 Document Reviewed: 06/28/2013 [facility] Patient Information 2015 [place], [place]. This information is not intended to replace advice given to you by your health care provider. Make sure you discuss any questions you have with your health care provider. not improving. You can expect this to take at least 5 days or so to run its course, sometimes up to 2 weeks.    HOPPER,DAVID, MD 08/08/2015

## 2015-08-08 NOTE — Patient Instructions (Signed)
Drink plenty of fluids and get enough rest  Take the metronidazole twice daily for the bacterial vaginosis. If symptoms persist will need to do a pelvic exam. Do not drink any alcohol while on this medication or you will vomit  Take an over-the-counter anti-histamine decongested such as Claritin-D or Allegra-D or Zyrtec-D one daily (loratadine D, fexofenadine D, or cetirizine D)  Use the fluticasone (Flonase) nose spray 2 sprays each nostril twice daily for 3 days, then once daily  Practice swallowing candy so he can learn to swallow pills.  Return if worse or Bacterial Vaginosis Bacterial vaginosis is a vaginal infection that occurs when the normal balance of bacteria in the vagina is disrupted. It results from an overgrowth of certain bacteria. This is the most common vaginal infection in women of childbearing age. Treatment is important to prevent complications, especially in pregnant women, as it can cause a premature delivery. CAUSES  Bacterial vaginosis is caused by an increase in harmful bacteria that are normally present in smaller amounts in the vagina. Several different kinds of bacteria can cause bacterial vaginosis. However, the reason that the condition develops is not fully understood. RISK FACTORS Certain activities or behaviors can put you at an increased risk of developing bacterial vaginosis, including:  Having a new sex partner or multiple sex partners.  Douching.  Using an intrauterine device (IUD) for contraception. Women do not get bacterial vaginosis from toilet seats, bedding, swimming pools, or contact with objects around them. SIGNS AND SYMPTOMS  Some women with bacterial vaginosis have no signs or symptoms. Common symptoms include:  Grey vaginal discharge.  A fishlike odor with discharge, especially after sexual intercourse.  Itching or burning of the vagina and vulva.  Burning or pain with urination. DIAGNOSIS  Your health care provider will take a  medical history and examine the vagina for signs of bacterial vaginosis. A sample of vaginal fluid may be taken. Your health care provider will look at this sample under a microscope to check for bacteria and abnormal cells. A vaginal pH test may also be done.  TREATMENT  Bacterial vaginosis may be treated with antibiotic medicines. These may be given in the form of a pill or a vaginal cream. A second round of antibiotics may be prescribed if the condition comes back after treatment.  HOME CARE INSTRUCTIONS   Only take over-the-counter or prescription medicines as directed by your health care provider.  If antibiotic medicine was prescribed, take it as directed. Make sure you finish it even if you start to feel better.  Do not have sex until treatment is completed.  Tell all sexual partners that you have a vaginal infection. They should see their health care provider and be treated if they have problems, such as a mild rash or itching.  Practice safe sex by using condoms and only having one sex partner. SEEK MEDICAL CARE IF:   Your symptoms are not improving after 3 days of treatment.  You have increased discharge or pain.  You have a fever. MAKE SURE YOU:   Understand these instructions.  Will watch your condition.  Will get help right away if you are not doing well or get worse. FOR MORE INFORMATION  Centers for Disease Control and Prevention, Division of STD Prevention: AppraiserFraud.fi American Sexual Health Association (ASHA): www.ashastd.org  Document Released: 11/16/2005 Document Revised: 09/06/2013 Document Reviewed: 06/28/2013 [facility] Patient Information 2015 [place], [place]. This information is not intended to replace advice given to you by your health care provider.  Make sure you discuss any questions you have with your health care provider. not improving. You can expect this to take at least 5 days or so to run its course, sometimes up to 2 weeks.

## 2015-09-04 ENCOUNTER — Ambulatory Visit (INDEPENDENT_AMBULATORY_CARE_PROVIDER_SITE_OTHER): Payer: Managed Care, Other (non HMO) | Admitting: Family Medicine

## 2015-09-04 ENCOUNTER — Ambulatory Visit: Payer: Managed Care, Other (non HMO)

## 2015-09-04 VITALS — BP 106/68 | HR 78 | Temp 98.5°F | Resp 20 | Ht 59.5 in | Wt 144.5 lb

## 2015-09-04 DIAGNOSIS — D229 Melanocytic nevi, unspecified: Secondary | ICD-10-CM

## 2015-09-04 DIAGNOSIS — L819 Disorder of pigmentation, unspecified: Secondary | ICD-10-CM

## 2015-09-04 NOTE — Progress Notes (Signed)
° °  Subjective:  This chart was scribed for Reginia Forts, MD by Thea Alken, ED Scribe. This patient was seen in room 1 and the patient's care was started at 7:16 PM.   Patient ID: Brandi Castillo, female    DOB: June 03, 1991, 24 y.o.   MRN: [id number]  HPI   Chief Complaint  Patient presents with   Nevus    Neck-Irritated and bleeding today   HPI Comments: Brandi Castillo is a 24 y.o. female who presents to the [facility] complaining of multiple, irritating skin lesions on her neck. Pt had irritation and bleeding to lesions on neck today. States they often get caught in her hair, causing pain, and that they have been growing, getting darker and changing colors. They have not been painful until today.  Past Medical History  Diagnosis Date   Anxiety    Yeast infection    Bacterial infection    H/O candidiasis    H/O varicella    Allergies  Allergen Reactions   Sulfa Drugs Cross Reactors Rash   Prior to Admission medications   Medication Sig Start Date End Date Taking? Authorizing Provider  Ibuprofen (MOTRIN PO) Take by mouth as needed.    Historical Provider, MD  Prenatal Vit-Fe Fumarate-FA (PRENATAL MULTIVITAMIN) TABS Take 1 tablet by mouth daily.      Historical Provider, MD   Review of Systems  Constitutional: Negative for fever, chills and diaphoresis.  Skin: Positive for color change. Negative for rash and wound.   Objective:   Physical Exam  Constitutional: She is oriented to person, place, and time. She appears well-developed and well-nourished. No distress.  HENT:  Head: Normocephalic and atraumatic.  Eyes: Conjunctivae and EOM are normal.  Neck: Neck supple.  Cardiovascular: Normal rate.   Pulmonary/Chest: Effort normal.  Musculoskeletal: Normal range of motion.  Neurological: She is alert and oriented to person, place, and time.  Skin: Skin is warm and dry.  inflamed skin tag on right lateral aspect of neck with blue and black  coloration.   Psychiatric: She has a normal mood and affect. Her behavior is normal.  Nursing note and vitals reviewed.   Filed Vitals:   09/04/15 1850  BP: 106/68  Pulse: 78  Temp: 98.5 F (36.9 C)  TempSrc: Oral  Resp: 20  Height: 4' 11.5" (1.511 m)  Weight: 144 lb 8 oz (65.545 kg)  SpO2: 98%    PROCEDURE:  VERBAL CONSENT OBTAINED; AREA CLEANSED WITH ALCOHOL; STERILE IRIS SCISSORS RESECTED SKIN LESION ON LATERAL NECK. SILVER NITRATE APPLIED TO WOUND.  GOOD HEMOSTASIS OBTAINED.  Assessment & Plan:   1. Bleeding pigmented skin lesion    -New. -S/p resection. -sent for pathology. -local wound care.   No orders of the defined types were placed in this encounter.    No orders of the defined types were placed in this encounter.    By signing my name below, I, Raven Small, attest that this documentation has been prepared under the direction and in the presence of Reginia Forts MD.  Electronically Signed: Thea Alken, ED Scribe. 09/04/2015. 7:32 PM.   I personally performed the services described in this documentation, which was scribed in my presence. The recorded information has been reviewed and considered.  Kristi Elayne Guerin, M.D. [facility] [address] [place], [place]  [address] [phone] phone [phone] fax

## 2016-02-13 ENCOUNTER — Encounter (HOSPITAL_BASED_OUTPATIENT_CLINIC_OR_DEPARTMENT_OTHER): Payer: Self-pay

## 2016-02-13 DIAGNOSIS — J069 Acute upper respiratory infection, unspecified: Secondary | ICD-10-CM | POA: Insufficient documentation

## 2016-02-13 DIAGNOSIS — Z8619 Personal history of other infectious and parasitic diseases: Secondary | ICD-10-CM | POA: Insufficient documentation

## 2016-02-13 DIAGNOSIS — Z8659 Personal history of other mental and behavioral disorders: Secondary | ICD-10-CM | POA: Insufficient documentation

## 2016-02-13 DIAGNOSIS — Z79899 Other long term (current) drug therapy: Secondary | ICD-10-CM | POA: Insufficient documentation

## 2016-02-13 DIAGNOSIS — J029 Acute pharyngitis, unspecified: Secondary | ICD-10-CM | POA: Diagnosis present

## 2016-02-13 LAB — RAPID STREP SCREEN (MED CTR MEBANE ONLY): STREPTOCOCCUS, GROUP A SCREEN (DIRECT): NEGATIVE

## 2016-02-13 NOTE — ED Notes (Signed)
Sore throat x 3 days-pos strep exposure in home

## 2016-02-14 ENCOUNTER — Emergency Department (HOSPITAL_BASED_OUTPATIENT_CLINIC_OR_DEPARTMENT_OTHER)
Admission: EM | Admit: 2016-02-14 | Discharge: 2016-02-14 | Disposition: A | Discharge status: Home / Self Care | Payer: Managed Care, Other (non HMO) | Attending: Emergency Medicine | Admitting: Emergency Medicine

## 2016-02-14 DIAGNOSIS — J069 Acute upper respiratory infection, unspecified: Secondary | ICD-10-CM

## 2016-02-14 MED ORDER — NAPROXEN 500 MG PO TABS: 500.0000 mg | ORAL_TABLET | Freq: Two times a day (BID) | ORAL | Status: DC

## 2016-02-14 MED ORDER — FLUTICASONE PROPIONATE 50 MCG/ACT NA SUSP: 2.0000 | Freq: Every day | NASAL | Status: DC

## 2016-02-14 MED ORDER — BENZONATATE 100 MG PO CAPS: 100.0000 mg | ORAL_CAPSULE | Freq: Three times a day (TID) | ORAL | Status: DC

## 2016-02-14 NOTE — ED Provider Notes (Signed)
CSN: [id number]     Arrival date & time 02/13/16  2118 History   First MD Initiated Contact with Patient 02/14/16 0009     Chief Complaint  Patient presents with  . Sore Throat     (Consider location/radiation/quality/duration/timing/severity/associated sxs/prior Treatment) The history is provided by the patient and medical records. No language interpreter was used.    Brandi Castillo is a 25 y.o. female  with a PMH of anxiety who presents to the Emergency Department complaining of moderate worsening occasionally productive cough, sore throat, nasal congestion for the last 3 days. Denies chest pain, shortness of breath, fever. Over-the-counter cough and cold with minimal relief. Children at home with strep throat.   Past Medical History  Diagnosis Date  . Anxiety   . Yeast infection   . Bacterial infection   . H/O candidiasis   . H/O varicella    History reviewed. No pertinent past surgical history. Family History  Problem Relation Age of Onset  . Asthma Mother   . Depression Mother   . Alcohol abuse Mother   . Alcohol abuse Father   . Asthma Sister   . Asthma Brother   . Diabetes Maternal Grandmother   . Alcohol abuse Maternal Grandmother   . Hypertension Maternal Grandfather   . Alcohol abuse Maternal Grandfather   . Alcohol abuse Paternal Grandmother   . Diabetes Paternal Grandfather   . Alcohol abuse Paternal Grandfather    Social History  Substance Use Topics  . Smoking status: Never Smoker   . Smokeless tobacco: Never Used  . Alcohol Use: No   OB History    Gravida Para Term Preterm AB TAB SAB Ectopic Multiple Living   1 1 1  0 0 0 0 0 0 1     Review of Systems  Constitutional: Negative for fever and chills.  HENT: Positive for congestion and sore throat.   Respiratory: Positive for cough. Negative for shortness of breath and wheezing.   Gastrointestinal: Negative for nausea, vomiting and abdominal pain.  Musculoskeletal: Positive for myalgias.   Neurological: Negative for headaches.      Allergies  Sulfa drugs cross reactors  Home Medications   Prior to Admission medications   Medication Sig Start Date End Date Taking? Authorizing Provider  benzonatate (TESSALON) 100 MG capsule Take 1 capsule (100 mg total) by mouth every 8 (eight) hours. 02/14/16   Johann Santone Pilcher Quincy Prisco, PA-C  fluticasone (FLONASE) 50 MCG/ACT nasal spray Place 2 sprays into both nostrils daily. 02/14/16   Ozella Almond Myrissa Chipley, PA-C  naproxen (NAPROSYN) 500 MG tablet Take 1 tablet (500 mg total) by mouth 2 (two) times daily. 02/14/16   Ozella Almond Uliana Brinker, PA-C  Prenatal Vit-Fe Fumarate-FA (PRENATAL MULTIVITAMIN) TABS Take 1 tablet by mouth daily.      Historical Provider, MD   BP 124/84 mmHg  Pulse 93  Temp(Src) 98.5 F (36.9 C) (Oral)  Resp 16  Ht 4\' 11"  (1.499 m)  Wt 65.318 kg  BMI 29.07 kg/m2  SpO2 100%  LMP 01/20/2016 Physical Exam  Constitutional: She is oriented to person, place, and time. She appears well-developed and well-nourished.  Alert, speaking in full sentences, and in no acute distress  HENT:  Head: Normocephalic and atraumatic.  Right Ear: Tympanic membrane, external ear and ear canal normal.  Left Ear: Tympanic membrane, external ear and ear canal normal.  Nose: Mucosal edema present. Right sinus exhibits no maxillary sinus tenderness and no frontal sinus tenderness. Left sinus exhibits no maxillary sinus tenderness and  no frontal sinus tenderness.  + nasal congestion. Oropharynx with erythema, no exudates or tonsillar hypertrophy.  Neck: Normal range of motion. Neck supple.  Cardiovascular: Normal rate, regular rhythm and normal heart sounds.  Exam reveals no gallop and no friction rub.   No murmur heard. Pulmonary/Chest: Effort normal and breath sounds normal. No respiratory distress. She has no wheezes. She has no rales. She exhibits no tenderness.  Abdominal: Soft. She exhibits no distension. There is no tenderness.  Musculoskeletal:  Normal range of motion.  Lymphadenopathy:    She has no cervical adenopathy.  Neurological: She is alert and oriented to person, place, and time.  Skin: Skin is warm and dry.  Psychiatric: She has a normal mood and affect. Her behavior is normal. Judgment and thought content normal.  Nursing note and vitals reviewed.   ED Course  Procedures (including critical care time) Labs Review Labs Reviewed  RAPID STREP SCREEN (NOT AT [facility])  CULTURE, GROUP A STREP [facility])    Imaging Review No results found. I have personally reviewed and evaluated these images and lab results as part of my medical decision-making.   EKG Interpretation None      MDM   Final diagnoses:  URI (upper respiratory infection)   Brandi Castillo is afebrile, non-toxic appearing with a clear lung exam. Mild rhinorrhea and OP with erythema, no exudates. Rapid strep negative. Likely viral URI. Patient is agreeable to symptomatic treatment with close follow up with PCP as needed but spoke at length about emergent, changing, or worsening of symptoms that should prompt return to ER. Patient voices understanding and is agreeable to plan.   Blood pressure 124/84, pulse 93, temperature 98.5 F (36.9 C), temperature source Oral, resp. rate 16, height 4\' 11"  (1.499 m), weight 65.318 kg, last menstrual period 01/20/2016, SpO2 100 %.  [facility] Brandi Abate, PA-C 02/14/16 0023  Veatrice Kells, MD 02/14/16 Brandi Castillo

## 2016-02-14 NOTE — Discharge Instructions (Signed)
1. Medications: flonase, mucinex, tessalon for cough, usual home medications 2. Treatment: rest, drink plenty of fluids, take tylenol or ibuprofen for fever control 3. Follow Up: Please followup with your primary doctor in 3 days for discussion of your diagnoses and further evaluation after today's visit; Return to the ER for high fevers, difficulty breathing or other concerning symptoms

## 2016-02-15 LAB — CULTURE, GROUP A STREP (THRC)

## 2016-04-29 ENCOUNTER — Encounter (HOSPITAL_COMMUNITY): Payer: Self-pay | Admitting: Emergency Medicine

## 2016-04-29 ENCOUNTER — Emergency Department (HOSPITAL_COMMUNITY)
Admission: EM | Admit: 2016-04-29 | Discharge: 2016-04-29 | Disposition: A | Discharge status: Home / Self Care | Payer: Worker's Compensation | Attending: Emergency Medicine | Admitting: Emergency Medicine

## 2016-04-29 DIAGNOSIS — Z7721 Contact with and (suspected) exposure to potentially hazardous body fluids: Secondary | ICD-10-CM

## 2016-04-29 NOTE — ED Provider Notes (Signed)
CSN: [id number]     Arrival date & time 04/29/16  1341 History   By signing my name below, I, Stephania Fragmin, attest that this documentation has been prepared under the direction and in the presence of Randolf Sansoucie, PA-C. Electronically Signed: Stephania Fragmin, ED Scribe. 04/29/2016. 2:37 PM.    Chief Complaint  Patient presents with  . Eye Problem   The history is provided by the patient. No language interpreter was used.    HPI Comments: Brandi Castillo is a 25 y.o. female who presents to the Emergency Department complaining of possible blood exposure to her eyes. Patient states she was at work taking patient's a1c with a new machine when the blood sample that she injected splattered back in her face. She states she had immediately shut her eyes, and she did not feel any blood hit her eye. However, she states the blood splattered her whole face, so she suspects there is a possibility a small amount may have touched her eye. Patient states she immediately washed her eye at the eyewash station. 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 She states she came to the ED for testing, per her work Actuary. Patient states she is up to date on all vaccinations. She denies eye pain.    Past Medical History  Diagnosis Date  . Anxiety   . Yeast infection   . Bacterial infection   . H/O candidiasis   . H/O varicella    No past surgical history on file. Family History  Problem Relation Age of Onset  . Asthma Mother   . Depression Mother   . Alcohol abuse Mother   . Alcohol abuse Father   . Asthma Sister   . Asthma Brother   . Diabetes Maternal Grandmother   . Alcohol abuse Maternal Grandmother   . Hypertension Maternal Grandfather   . Alcohol abuse Maternal Grandfather   . Alcohol abuse Paternal Grandmother   . Diabetes Paternal Grandfather   . Alcohol abuse Paternal Grandfather    Social History  Substance Use Topics  . Smoking status: Never  Smoker   . Smokeless tobacco: Never Used  . Alcohol Use: No   OB History    Gravida Para Term Preterm AB TAB SAB Ectopic Multiple Living   1 1 1  0 0 0 0 0 0 1     Review of Systems  Eyes: Negative for pain.  All other systems reviewed and are negative.   Allergies  Sulfa drugs cross reactors  Home Medications   Prior to Admission medications   Medication Sig Start Date End Date Taking? Authorizing Provider  benzonatate (TESSALON) 100 MG capsule Take 1 capsule (100 mg total) by mouth every 8 (eight) hours. 02/14/16   Jaime Pilcher Ward, PA-C  fluticasone (FLONASE) 50 MCG/ACT nasal spray Place 2 sprays into both nostrils daily. 02/14/16   Ozella Almond Ward, PA-C  naproxen (NAPROSYN) 500 MG tablet Take 1 tablet (500 mg total) by mouth 2 (two) times daily. 02/14/16   Ozella Almond Ward, PA-C  Prenatal Vit-Fe Fumarate-FA (PRENATAL MULTIVITAMIN) TABS Take 1 tablet by mouth daily.      Historical Provider, MD   BP 121/79 mmHg  Pulse 81  Temp(Src) 97.9 F (36.6 C) (Oral)  Resp 18  SpO2 100% Physical Exam  Constitutional: She is oriented to person, place, and time. She appears well-developed and well-nourished. No distress.  HENT:  Head: Normocephalic and atraumatic.  Eyes: Conjunctivae, EOM and lids are normal.  Pupils are equal, round, and reactive to light. Right eye exhibits no discharge and no exudate. No foreign body present in the right eye. Left eye exhibits no discharge and no exudate. No foreign body present in the left eye. No scleral icterus.  Cardiovascular: Normal rate.   Pulmonary/Chest: Effort normal.  Neurological: She is alert and oriented to person, place, and time. Coordination normal.  Skin: Skin is warm and dry. No rash noted. She is not diaphoretic. No erythema. No pallor.  Psychiatric: She has a normal mood and affect. Her behavior is normal.  Nursing note and vitals reviewed.   ED Course  Procedures (including critical care time)  DIAGNOSTIC  STUDIES: Oxygen Saturation is 100% on RA, normal by my interpretation.    COORDINATION OF CARE: 2:25 PM - Will attempt to call patient's employer. Pt verbalized understanding and agreed to plan.   2:36 PM - Phone call with patient's employer [phone]). They advised testing for hepatitis B, hepatitis C, and HIV.   Labs Review Labs Reviewed  HIV ANTIBODY (ROUTINE TESTING)  HEPATITIS PANEL, ACUTE   MDM   Final diagnoses:  Exposure to blood   Pt Presents after having exposure to blood while at work. There is a possibility that this problem has splashed into her left eye. She exhibits no symptoms of eye pain, discharge or irritation. Conjunctivae is normal. No change in her vision. She flushed her eye out while at work. HIV, hepatitis panel collected per her employer protocol. Patient may obtain these results through medical records after the result. Patient may follow up with her PCP as needed.  I personally performed the services described in this documentation, which was scribed in my presence. The recorded information has been reviewed and is accurate.       Dondra Spry [place], PA-C 04/29/16 [place], MD 04/29/16 [phone]

## 2016-04-29 NOTE — Discharge Instructions (Signed)
Body Fluid Exposure Information °People may come into contact with blood and other body fluids under various circumstances. In some cases, body fluids may contain germs (bacteria or viruses) that cause infections. These germs can be spread when another person's body fluids come into contact with your skin, mouth, eyes, or genitals.  °Exposure to body fluids that may contain infectious material is a common problem for people providing care for others who are ill. It can occur when a person is performing health care tasks in the workplace or when taking care of a family member at home. Other common methods of exposure include injection drug use, sharing needles, and sexual activity. °The risk of an infection spreading through body fluid exposure is small and depends on a variety of factors. This includes the type of body fluid, the nature of the exposure, and the health status of the person who was the source of the body fluids. Your health care provider can help you assess the risk. °WHAT TYPES OF BODY FLUID CAN SPREAD INFECTION? °The following types of body fluid have the potential to spread infections: °· Blood. °· Semen. °· Vaginal secretions. °· Urine. °· Feces. °· Saliva. °· Nasal or eye discharge. °· Breast milk. °· Amniotic fluid and fluids surrounding body organs. °WHAT ARE SOME FIRST-AID MEASURES FOR BODY FLUID EXPOSURE? °The following steps should be taken as soon as possible after a person is exposed to body fluids: °Intact Skin °· For contact with closed skin, wash the area with soap and water. °Broken Skin °· For contact with broken skin (a wound), wash the area with soap and water. Let the area bleed a little. Then place a bandage or clean towel on the wound, applying gentle pressure to stop the bleeding. Do not squeeze or rub the area. °· Use just water or hand sanitizer if a sink with soap is not available. °· Do not use harsh chemicals such as bleach or iodine. °Eyes °· Rinse the eyes with water or  saline for 30 seconds. °· If the person is wearing contact lenses, leave the contact lenses in while rinsing the eyes. Once the rinsing is complete, remove the contact lenses. °Mouth °· Spit out the fluids. Rinse and spit with water 4-5 times. °In addition, you should remove any clothing that comes into contact with body fluids. However, if body fluid exposure results from sexual assault, seek medical care immediately without changing clothes or bathing. °WHEN SHOULD YOU SEEK HELP? °After performing the proper first-aid steps, you should contact your health care provider or seek emergency care right away if blood or other body fluids made contact with areas of broken skin or openings such as the eyes or mouth. If the exposure to body fluid happened in the workplace, you should report it to your work supervisor immediately. Many workplaces have procedures in place for exposure situations. °WHAT WILL HAPPEN AFTER YOU REPORT THE EXPOSURE? °Your health care provider will ask you several questions. Information requested may include: °· Your medical history, including vaccination records. °· Date and time of the exposure. °· Whether you saw body fluids during the exposure. °· Type of body fluid you were exposed to. °· Volume of body fluid you were exposed to. °· How the exposure happened. °· If any devices, such as a needle, were being used. °· Which area of your body made contact with the body fluid. °· Description of any injury to the skin or other area. °· How long contact was made with the body   fluid.  Any information you have about the health status of the person whose body fluid you were exposed to. The health care provider will assess your risk of infection. Often, no treatment is necessary. In some cases, the health care provider may recommend doing blood tests right away. Follow-up blood tests may also be done at certain intervals during the upcoming weeks and months to check for changes. You may be offered  treatment to prevent an infection from developing after exposure (post-exposure prophylaxis). This may include certain vaccinations or medicines and may be necessary when there is a risk of a serious infection, such as HIV or hepatitis B. Your health care provider should discuss appropriate treatment and vaccinations with you. HOW CAN YOU PREVENT EXPOSURE AND INFECTION? Always remember that prevention is the first line of defense against body fluid exposure. To help prevent exposure to body fluids:  Wash and disinfect countertops and other surfaces regularly.  Wear appropriate protective gear such as gloves, gowns, or eyewear when the possibility of exposure is present.  Wipe away spills of body fluid with disposable towels.  Properly dispose of blood products and other fluids. Use secured bags.  Properly dispose of needles and other instruments with sharp points or edges (sharps). Use closed, marked containers.  Avoid injection drug use.  Do not share needles.  Avoid recapping needles.  Use a condom during sexual intercourse.  Make sure you learn and follow any guidelines for preventing exposure (universal precautions) provided at your workplace. To help reduce your chances of getting an infection:  Make sure your vaccinations are up-to-date, including those for tetanus and hepatitis.  Wash your hands frequently with soap and water. Use hand sanitizers.  Avoid having multiple sex partners.  Follow up with your health care provider as directed after being evaluated for an exposure to body fluids. To avoid spreading infection to others:  Do not have sexual relations until you know you are free of infection.  Do not donate blood, plasma, breast milk, sperm, or other body fluids.  Do not share hygiene tools such as toothbrushes, razors, or dental floss.  Keep open wounds covered.  Dispose of any items with blood on them (razors, tampons, bandages) by putting them in the  trash.  Do not share drug supplies with others, such as needles, syringes, straws, or pipes.  Follow all of your health care provider's instructions for preventing the spread of infection.   This information is not intended to replace advice given to you by your health care provider. Make sure you discuss any questions you have with your health care provider.   You had HIV, hepatitis B and C labs collected today. These will take a few days for the results to come back. You will need to contact medical records in order to get these results. Follow up with your primary care provider as needed. Return to the ED if you experience blurry vision, abdominal pain, fevers, eye pain.

## 2016-04-29 NOTE — ED Notes (Signed)
Pt states she was checking a patient's A1C and when she went to put the blood sample into the machine, it splattered back in her face. Used the eye wash station but due to policy at her work, had to come to the ER.  Denies any pain.

## 2016-04-30 LAB — HIV ANTIBODY (ROUTINE TESTING W REFLEX): HIV Screen 4th Generation wRfx: NONREACTIVE

## 2016-04-30 LAB — HEPATITIS PANEL, ACUTE
HCV Ab: <0.1 {s_co_ratio} (ref 0.0–0.9)
Hep A IgM: NEGATIVE
Hep B C IgM: NEGATIVE
Hepatitis B Surface Ag: NEGATIVE

## 2017-01-11 ENCOUNTER — Inpatient Hospital Stay (HOSPITAL_COMMUNITY)
Admission: AD | Admit: 2017-01-11 | Discharge: 2017-01-11 | Disposition: A | Discharge status: Home / Self Care | Payer: Managed Care, Other (non HMO) | Source: Ambulatory Visit | Attending: Obstetrics and Gynecology | Admitting: Obstetrics and Gynecology

## 2017-01-11 ENCOUNTER — Encounter: Payer: Self-pay | Admitting: Student

## 2017-01-11 DIAGNOSIS — Z3A08 8 weeks gestation of pregnancy: Secondary | ICD-10-CM | POA: Diagnosis not present

## 2017-01-11 DIAGNOSIS — J029 Acute pharyngitis, unspecified: Secondary | ICD-10-CM

## 2017-01-11 DIAGNOSIS — O219 Vomiting of pregnancy, unspecified: Secondary | ICD-10-CM | POA: Diagnosis not present

## 2017-01-11 DIAGNOSIS — O210 Mild hyperemesis gravidarum: Secondary | ICD-10-CM | POA: Insufficient documentation

## 2017-01-11 DIAGNOSIS — O99511 Diseases of the respiratory system complicating pregnancy, first trimester: Secondary | ICD-10-CM | POA: Insufficient documentation

## 2017-01-11 LAB — URINALYSIS, ROUTINE W REFLEX MICROSCOPIC
Bilirubin Urine: NEGATIVE
Glucose, UA: NEGATIVE mg/dL
Hgb urine dipstick: NEGATIVE
Ketones, ur: NEGATIVE mg/dL
Leukocytes, UA: NEGATIVE
Nitrite: NEGATIVE
Protein, ur: NEGATIVE mg/dL
Specific Gravity, Urine: 1.021 (ref 1.005–1.030)
pH: 8 (ref 5.0–8.0)

## 2017-01-11 LAB — INFLUENZA PANEL BY PCR (TYPE A & B)
INFLAPCR: NEGATIVE
INFLBPCR: NEGATIVE

## 2017-01-11 LAB — POCT PREGNANCY, URINE: Preg Test, Ur: POSITIVE — AB

## 2017-01-11 LAB — RAPID STREP SCREEN (MED CTR MEBANE ONLY): STREPTOCOCCUS, GROUP A SCREEN (DIRECT): NEGATIVE

## 2017-01-11 MED ORDER — TERCONAZOLE 0.8 % VA CREA: 1.0000 | TOPICAL_CREAM | Freq: Every day | VAGINAL | 0 refills | Status: DC

## 2017-01-11 MED ORDER — PROMETHAZINE HCL 25 MG PO TABS: 25.0000 mg | ORAL_TABLET | Freq: Four times a day (QID) | ORAL | 0 refills | Status: DC | PRN

## 2017-01-11 MED ORDER — METOCLOPRAMIDE HCL 10 MG PO TABS
10.0000 mg | ORAL_TABLET | Freq: Once | ORAL | Status: AC
  Administered 2017-01-11: 10 mg via ORAL
  Filled 2017-01-11: qty 1

## 2017-01-11 MED ORDER — PENICILLIN V POTASSIUM 500 MG PO TABS: 500.0000 mg | ORAL_TABLET | Freq: Two times a day (BID) | ORAL | 0 refills | Status: DC

## 2017-01-11 NOTE — MAU Provider Note (Signed)
History     CSN: [id number]  Arrival date and time: 01/11/17 1014   First Provider Initiated Contact with Patient 01/11/17 1113      Chief Complaint  Patient presents with  . Cough  . Sore Throat   Brandi Castillo is a 26 y.o. G2P1001 at [redacted]w[redacted]d who presents with cough & sore throat. Symptoms began Friday. Patient states her daughter had strep & flu 2 weeks ago.  Plans on going to [facility] for prenatal care -- has not scheduled initial appt yet.  Denies abdominal pain or vaginal bleeding.    Cough  This is a new problem. The current episode started in the past 7 days. The problem has been unchanged. The cough is non-productive. Associated symptoms include chills, headaches, nasal congestion and a sore throat. Pertinent negatives include no chest pain, ear pain, fever, shortness of breath or wheezing. She has tried nothing for the symptoms. There is no history of asthma or pneumonia.  Sore Throat   This is a new problem. The current episode started in the past 7 days. The problem has been gradually worsening. There has been no fever. The pain is at a severity of 6/10. Associated symptoms include congestion, coughing, diarrhea (x1 episode this morning) and headaches. Pertinent negatives include no abdominal pain, ear pain, shortness of breath, trouble swallowing or vomiting. She has had exposure to strep. She has had no exposure to mono. She has tried nothing for the symptoms.    OB History    Gravida Para Term Preterm AB Living   2 1 1  0 0 1   SAB TAB Ectopic Multiple Live Births   0 0 0 0 1      Past Medical History:  Diagnosis Date  . Anxiety   . Bacterial infection   . H/O candidiasis   . H/O varicella   . Yeast infection     History reviewed. No pertinent surgical history.  Family History  Problem Relation Age of Onset  . Asthma Mother   . Depression Mother   . Alcohol abuse Mother   . Alcohol abuse Father   . Asthma Sister   . Asthma Brother   . Diabetes Maternal  Grandmother   . Alcohol abuse Maternal Grandmother   . Hypertension Maternal Grandfather   . Alcohol abuse Maternal Grandfather   . Alcohol abuse Paternal Grandmother   . Diabetes Paternal Grandfather   . Alcohol abuse Paternal Grandfather     Social History  Substance Use Topics  . Smoking status: Never Smoker  . Smokeless tobacco: Never Used  . Alcohol use No    Allergies:  Allergies  Allergen Reactions  . Sulfa Drugs Cross Reactors Rash    Prescriptions Prior to Admission  Medication Sig Dispense Refill Last Dose  . benzonatate (TESSALON) 100 MG capsule Take 1 capsule (100 mg total) by mouth every 8 (eight) hours. 21 capsule 0   . fluticasone (FLONASE) 50 MCG/ACT nasal spray Place 2 sprays into both nostrils daily. 16 g 0   . naproxen (NAPROSYN) 500 MG tablet Take 1 tablet (500 mg total) by mouth 2 (two) times daily. 30 tablet 0   . Prenatal Vit-Fe Fumarate-FA (PRENATAL MULTIVITAMIN) TABS Take 1 tablet by mouth daily.     Taking    Review of Systems  Constitutional: Positive for chills and fatigue. Negative for fever.  HENT: Positive for congestion, sore throat and voice change. Negative for ear pain and trouble swallowing.   Respiratory: Positive for cough. Negative for  shortness of breath and wheezing.   Cardiovascular: Negative for chest pain.  Gastrointestinal: Positive for diarrhea (x1 episode this morning) and nausea. Negative for abdominal pain, constipation and vomiting.  Genitourinary: Negative.   Neurological: Positive for headaches.   Physical Exam   Blood pressure 117/73, pulse 87, temperature 99 F (37.2 C), temperature source Oral, resp. rate 16, weight 149 lb 4 oz (67.7 kg), last menstrual period 11/12/2016, SpO2 100 %.  Physical Exam  Nursing note and vitals reviewed. Constitutional: She is oriented to person, place, and time. She appears well-developed and well-nourished. No distress.  HENT:  Head: Normocephalic and atraumatic.  Right Ear: Tympanic  membrane normal.  Left Ear: Tympanic membrane normal.  Nose: Nose normal.  Mouth/Throat: Posterior oropharyngeal erythema present. No oropharyngeal exudate or posterior oropharyngeal edema.  Eyes: Conjunctivae are normal. Right eye exhibits no discharge. Left eye exhibits no discharge. No scleral icterus.  Neck: Normal range of motion.  Cardiovascular: Normal rate, regular rhythm and normal heart sounds.   No murmur heard. Respiratory: Effort normal and breath sounds normal. No respiratory distress. She has no wheezes.  GI: Soft. Bowel sounds are normal. There is no tenderness.  Neurological: She is alert and oriented to person, place, and time.  Skin: Skin is warm and dry. She is not diaphoretic.  Psychiatric: She has a normal mood and affect. Her behavior is normal. Judgment and thought content normal.    MAU Course  Procedures Results for orders placed or performed during the hospital encounter of 01/11/17 (from the past 24 hour(s))  Influenza panel by PCR (type A & B)     Status: None   Collection Time: 01/11/17 10:26 AM  Result Value Ref Range   Influenza A By PCR NEGATIVE NEGATIVE   Influenza B By PCR NEGATIVE NEGATIVE  Urinalysis, Routine w reflex microscopic     Status: None   Collection Time: 01/11/17 10:28 AM  Result Value Ref Range   Color, Urine YELLOW YELLOW   APPearance CLEAR CLEAR   Specific Gravity, Urine 1.021 1.005 - 1.030   pH 8.0 5.0 - 8.0   Glucose, UA NEGATIVE NEGATIVE mg/dL   Hgb urine dipstick NEGATIVE NEGATIVE   Bilirubin Urine NEGATIVE NEGATIVE   Ketones, ur NEGATIVE NEGATIVE mg/dL   Protein, ur NEGATIVE NEGATIVE mg/dL   Nitrite NEGATIVE NEGATIVE   Leukocytes, UA NEGATIVE NEGATIVE  Pregnancy, urine POC     Status: Abnormal   Collection Time: 01/11/17 10:47 AM  Result Value Ref Range   Preg Test, Ur POSITIVE (A) NEGATIVE    MDM Flu swab negative Strep swab pending VSS, NAD Reglan 10 mg PO  Assessment and Plan  A; 1. Acute pharyngitis,  unspecified etiology   2. Nausea and vomiting during pregnancy    P: Discharge home Strep swab pending Rx terazol per patient request Rx penicillin  Rx phenergan for nausea Start prenatal care  [name] 01/11/2017, 11:12 AM

## 2017-01-11 NOTE — Discharge Instructions (Signed)
Pharyngitis Pharyngitis is redness, pain, and swelling (inflammation) of your pharynx. What are the causes? Pharyngitis is usually caused by infection. Most of the time, these infections are from viruses (viral) and are part of a cold. However, sometimes pharyngitis is caused by bacteria (bacterial). Pharyngitis can also be caused by allergies. Viral pharyngitis may be spread from person to person by coughing, sneezing, and personal items or utensils (cups, forks, spoons, toothbrushes). Bacterial pharyngitis may be spread from person to person by more intimate contact, such as kissing. What are the signs or symptoms? Symptoms of pharyngitis include:  Sore throat.  Tiredness (fatigue).  Low-grade fever.  Headache.  Joint pain and muscle aches.  Skin rashes.  Swollen lymph nodes.  Plaque-like film on throat or tonsils (often seen with bacterial pharyngitis). How is this diagnosed? Your health care provider will ask you questions about your illness and your symptoms. Your medical history, along with a physical exam, is often all that is needed to diagnose pharyngitis. Sometimes, a rapid strep test is done. Other lab tests may also be done, depending on the suspected cause. How is this treated? Viral pharyngitis will usually get better in 3-4 days without the use of medicine. Bacterial pharyngitis is treated with medicines that kill germs (antibiotics). Follow these instructions at home:  Drink enough water and fluids to keep your urine clear or pale yellow.  Only take over-the-counter or prescription medicines as directed by your health care provider:  If you are prescribed antibiotics, make sure you finish them even if you start to feel better.  Do not take aspirin.  Get lots of rest.  Gargle with 8 oz of salt water ( tsp of salt per 1 qt of water) as often as every 1-2 hours to soothe your throat.  Throat lozenges (if you are not at risk for choking) or sprays may be used to  soothe your throat. Contact a health care provider if:  You have large, tender lumps in your neck.  You have a rash.  You cough up green, yellow-brown, or bloody spit. Get help right away if:  Your neck becomes stiff.  You drool or are unable to swallow liquids.  You vomit or are unable to keep medicines or liquids down.  You have severe pain that does not go away with the use of recommended medicines.  You have trouble breathing (not caused by a stuffy nose). This information is not intended to replace advice given to you by your health care provider. Make sure you discuss any questions you have with your health care provider. Document Released: 11/16/2005 Document Revised: 04/23/2016 Document Reviewed: 07/24/2013 Elsevier Interactive Patient Education  2017 [place] Medications in Pregnancy   Acne: Benzoyl Peroxide Salicylic Acid  Backache/Headache: Tylenol: 2 regular strength every 4 hours OR              2 Extra strength every 6 hours  Colds/Coughs/Allergies: Benadryl (alcohol free) 25 mg every 6 hours as needed Breath right strips Claritin Cepacol throat lozenges Chloraseptic throat spray Cold-Eeze- up to three times per day Cough drops, alcohol free Flonase (by prescription only) Guaifenesin Mucinex Robitussin DM (plain only, alcohol free) Saline nasal spray/drops Sudafed (pseudoephedrine) & Actifed ** use only after [redacted] weeks gestation and if you do not have high blood pressure Tylenol Vicks Vaporub Zinc lozenges Zyrtec   Constipation: Colace Ducolax suppositories Fleet enema Glycerin suppositories Metamucil Milk of magnesia Miralax Senokot Smooth move tea  Diarrhea: Kaopectate  Imodium A-D  *NO pepto Bismol  Hemorrhoids: Anusol Anusol HC Preparation H Tucks  Indigestion: Tums Maalox Mylanta Zantac  Pepcid  Insomnia: Benadryl (alcohol free) 25mg  every 6 hours as needed Tylenol PM Unisom, no Gelcaps  Leg  Cramps: Tums MagGel  Nausea/Vomiting:  Bonine Dramamine Emetrol Ginger extract Sea bands Meclizine  Nausea medication to take during pregnancy:  Unisom (doxylamine succinate 25 mg tablets) Take one tablet daily at bedtime. If symptoms are not adequately controlled, the dose can be increased to a maximum recommended dose of two tablets daily (1/2 tablet in the morning, 1/2 tablet mid-afternoon and one at bedtime). Vitamin B6 100mg  tablets. Take one tablet twice a day (up to 200 mg per day).  Skin Rashes: Aveeno products Benadryl cream or 25mg  every 6 hours as needed Calamine Lotion 1% cortisone cream  Yeast infection: Gyne-lotrimin 7 Monistat 7  Gum/tooth pain: Anbesol  **If taking multiple medications, please check labels to avoid duplicating the same active ingredients **take medication as directed on the label ** Do not exceed 4000 mg of tylenol in 24 hours **Do not take medications that contain aspirin or ibuprofen

## 2017-01-11 NOTE — MAU Note (Signed)
Since Friday has been having sore throat, this morning started having chills, felt like fever but didn't check it. non-productive cough.bad headache, Tylenol has not helped.  Has been nauseated, had diarrhea x1.  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  Has not been seen with preg yet

## 2017-01-14 LAB — CULTURE, GROUP A STREP (THRC)

## 2017-02-03 ENCOUNTER — Encounter (HOSPITAL_COMMUNITY): Payer: Self-pay

## 2017-02-03 ENCOUNTER — Inpatient Hospital Stay (HOSPITAL_COMMUNITY): Payer: Managed Care, Other (non HMO)

## 2017-02-03 ENCOUNTER — Inpatient Hospital Stay (HOSPITAL_COMMUNITY)
Admission: AD | Admit: 2017-02-03 | Discharge: 2017-02-04 | Disposition: A | Discharge status: Home / Self Care | Payer: Managed Care, Other (non HMO) | Source: Ambulatory Visit | Attending: Obstetrics & Gynecology | Admitting: Obstetrics & Gynecology

## 2017-02-03 DIAGNOSIS — Z3A01 Less than 8 weeks gestation of pregnancy: Secondary | ICD-10-CM | POA: Diagnosis not present

## 2017-02-03 DIAGNOSIS — O209 Hemorrhage in early pregnancy, unspecified: Secondary | ICD-10-CM | POA: Diagnosis present

## 2017-02-03 DIAGNOSIS — O021 Missed abortion: Secondary | ICD-10-CM

## 2017-02-03 LAB — URINALYSIS, ROUTINE W REFLEX MICROSCOPIC
BACTERIA UA: NONE SEEN
Bilirubin Urine: NEGATIVE
Glucose, UA: NEGATIVE mg/dL
KETONES UR: NEGATIVE mg/dL
Leukocytes, UA: NEGATIVE
Nitrite: NEGATIVE
PH: 7 (ref 5.0–8.0)
Protein, ur: NEGATIVE mg/dL
SPECIFIC GRAVITY, URINE: 1.015 (ref 1.005–1.030)

## 2017-02-03 LAB — CBC
HCT: 35.5 % — ABNORMAL LOW (ref 36.0–46.0)
HEMOGLOBIN: 11.9 g/dL — AB (ref 12.0–15.0)
MCH: 30.4 pg (ref 26.0–34.0)
MCHC: 33.5 g/dL (ref 30.0–36.0)
MCV: 90.8 fL (ref 78.0–100.0)
Platelets: 267 10*3/uL (ref 150–400)
RBC: 3.91 MIL/uL (ref 3.87–5.11)
RDW: 13.4 % (ref 11.5–15.5)
WBC: 12.5 10*3/uL — ABNORMAL HIGH (ref 4.0–10.5)

## 2017-02-03 LAB — WET PREP, GENITAL
CLUE CELLS WET PREP: NONE SEEN
Sperm: NONE SEEN
TRICH WET PREP: NONE SEEN
Yeast Wet Prep HPF POC: NONE SEEN

## 2017-02-03 LAB — HCG, QUANTITATIVE, PREGNANCY: HCG, BETA CHAIN, QUANT, S: 22698 m[IU]/mL — AB (ref <5)

## 2017-02-03 NOTE — MAU Provider Note (Signed)
History     CSN: [id number]  Arrival date and time: 02/03/17 2215   First Provider Initiated Contact with Patient 02/03/17 2237      Chief Complaint  Patient presents with  . Vaginal Bleeding   Vaginal Bleeding  The patient's primary symptoms include pelvic pain and vaginal bleeding. This is a new problem. Episode onset: about 3 days ago.  The problem occurs constantly. The problem has been gradually worsening. Pain severity now: 3/10  The problem affects both sides. She is pregnant. Pertinent negatives include no chills, dysuria, fever, nausea, urgency or vomiting. The vaginal bleeding is typical of menses. Passing clots: passed one clot the first day she had bleeding after intercourse.  She has not been passing tissue. Exacerbated by: intercourse.  She has tried nothing for the symptoms. She is sexually active. Menstrual history: LMP: 11/12/16    Past Medical History:  Diagnosis Date  . Anxiety   . Bacterial infection   . H/O candidiasis   . H/O varicella   . Yeast infection     No past surgical history on file.  Family History  Problem Relation Age of Onset  . Asthma Mother   . Depression Mother   . Alcohol abuse Mother   . Alcohol abuse Father   . Asthma Sister   . Asthma Brother   . Diabetes Maternal Grandmother   . Alcohol abuse Maternal Grandmother   . Hypertension Maternal Grandfather   . Alcohol abuse Maternal Grandfather   . Alcohol abuse Paternal Grandmother   . Diabetes Paternal Grandfather   . Alcohol abuse Paternal Grandfather     Social History  Substance Use Topics  . Smoking status: Never Smoker  . Smokeless tobacco: Never Used  . Alcohol use No    Allergies:  Allergies  Allergen Reactions  . Sulfa Drugs Cross Reactors Rash    Prescriptions Prior to Admission  Medication Sig Dispense Refill Last Dose  . penicillin v potassium (VEETID) 500 MG tablet Take 1 tablet (500 mg total) by mouth 2 (two) times daily. 20 tablet 0   . Prenatal Vit-Fe  Fumarate-FA (PRENATAL MULTIVITAMIN) TABS Take 1 tablet by mouth daily.     Taking  . promethazine (PHENERGAN) 25 MG tablet Take 1 tablet (25 mg total) by mouth every 6 (six) hours as needed for nausea or vomiting. 30 tablet 0   . terconazole (TERAZOL 3) 0.8 % vaginal cream Place 1 applicator vaginally at bedtime. 20 g 0     Review of Systems  Constitutional: Negative for chills and fever.  Gastrointestinal: Negative for nausea and vomiting.  Genitourinary: Positive for pelvic pain and vaginal bleeding. Negative for dysuria and urgency.   Physical Exam   Blood pressure 143/86, pulse 105, temperature 98 F (36.7 C), temperature source Oral, resp. rate 18, last menstrual period 11/12/2016.  Physical Exam  Nursing note and vitals reviewed. Constitutional: She is oriented to person, place, and time. She appears well-developed and well-nourished. No distress.  HENT:  Head: Normocephalic.  Cardiovascular: Normal rate.   Respiratory: Effort normal.  GI: Soft. There is no tenderness. There is no rebound.  Genitourinary:  Genitourinary Comments:  External: no lesion Vagina: scant amount of bright red blood  Cervix: pink, smooth, no CMT Uterus: NSSC Adnexa: NT   Neurological: She is alert and oriented to person, place, and time.  Skin: Skin is warm and dry.  Psychiatric: She has a normal mood and affect.    Results for orders placed or performed during the  hospital encounter of 02/03/17 (from the past 24 hour(s))  Urinalysis, Routine w reflex microscopic     Status: Abnormal   Collection Time: 02/03/17 10:20 PM  Result Value Ref Range   Color, Urine YELLOW YELLOW   APPearance HAZY (A) CLEAR   Specific Gravity, Urine 1.015 1.005 - 1.030   pH 7.0 5.0 - 8.0   Glucose, UA NEGATIVE NEGATIVE mg/dL   Hgb urine dipstick LARGE (A) NEGATIVE   Bilirubin Urine NEGATIVE NEGATIVE   Ketones, ur NEGATIVE NEGATIVE mg/dL   Protein, ur NEGATIVE NEGATIVE mg/dL   Nitrite NEGATIVE NEGATIVE    Leukocytes, UA NEGATIVE NEGATIVE   RBC / HPF TOO NUMEROUS TO COUNT 0 - 5 RBC/hpf   WBC, UA 0-5 0 - 5 WBC/hpf   Bacteria, UA NONE SEEN NONE SEEN   Squamous Epithelial / LPF 6-30 (A) NONE SEEN   Mucous PRESENT   Wet prep, genital     Status: Abnormal   Collection Time: 02/03/17 10:49 PM  Result Value Ref Range   Yeast Wet Prep HPF POC NONE SEEN NONE SEEN   Trich, Wet Prep NONE SEEN NONE SEEN   Clue Cells Wet Prep HPF POC NONE SEEN NONE SEEN   WBC, Wet Prep HPF POC MANY (A) NONE SEEN   Sperm NONE SEEN   CBC     Status: Abnormal   Collection Time: 02/03/17 10:50 PM  Result Value Ref Range   WBC 12.5 (H) 4.0 - 10.5 K/uL   RBC 3.91 3.87 - 5.11 MIL/uL   Hemoglobin 11.9 (L) 12.0 - 15.0 g/dL   HCT 35.5 (L) 36.0 - 46.0 %   MCV 90.8 78.0 - 100.0 fL   MCH 30.4 26.0 - 34.0 pg   MCHC 33.5 30.0 - 36.0 g/dL   RDW 13.4 11.5 - 15.5 %   Platelets 267 150 - 400 K/uL  hCG, quantitative, pregnancy     Status: Abnormal   Collection Time: 02/03/17 10:50 PM  Result Value Ref Range   hCG, Beta Chain, Quant, S 22,698 (H) <5 mIU/mL   US Ob Comp Less 14 Wks  Result Date: 02/03/2017 CLINICAL DATA:  Initial evaluation for heavy vaginal bleeding today, currently pregnant. Beta HCG = pending EXAM: OBSTETRIC <14 WK [place] AND TRANSVAGINAL OB US TECHNIQUE: Both transabdominal and transvaginal ultrasound examinations were performed for complete evaluation of the gestation as well as the maternal uterus, adnexal regions, and pelvic cul-de-sac. Transvaginal technique was performed to assess early pregnancy. COMPARISON:  None available. FINDINGS: Intrauterine gestational sac: Single. The gestational sac is somewhat irregular in appearance with angular margins. Some internal echogenic debris without definable yolk sac or other embryonic structures. Yolk sac:  None visualized. Embryo:  None visualized. Cardiac Activity: N/A Heart Rate: N/A  bpm MSD: 27  mm   7 w   5  d CRL:    mm    w    d                  [place] EDC: Subchorionic  hemorrhage:  Small subchorionic hemorrhage. Maternal uterus/adnexae: Right ovary unremarkable. Left ovary not visualized. No other abnormality identified within the adnexa. No free fluid. IMPRESSION: 1. Single irregular intrauterine gestational sac containing irregular internal echogenic debris without definable yolk sac or embryo. Mean sac diameter equals 27 mm. Findings meet definitive criteria for failed pregnancy. This follows SRU consensus guidelines: Diagnostic Criteria for Nonviable Pregnancy Early in the First Trimester. Alison Stalling J Med [phone]. 2. Small subchorionic hemorrhage. 3. No  other acute abnormality identified within the pelvis. Electronically Signed   By: Jeannine Boga M.D.   On: 02/03/2017 23:51   US Ob Transvaginal  Result Date: 02/03/2017 CLINICAL DATA:  Initial evaluation for heavy vaginal bleeding today, currently pregnant. Beta HCG = pending EXAM: OBSTETRIC <14 WK [place] AND TRANSVAGINAL OB US TECHNIQUE: Both transabdominal and transvaginal ultrasound examinations were performed for complete evaluation of the gestation as well as the maternal uterus, adnexal regions, and pelvic cul-de-sac. Transvaginal technique was performed to assess early pregnancy. COMPARISON:  None available. FINDINGS: Intrauterine gestational sac: Single. The gestational sac is somewhat irregular in appearance with angular margins. Some internal echogenic debris without definable yolk sac or other embryonic structures. Yolk sac:  None visualized. Embryo:  None visualized. Cardiac Activity: N/A Heart Rate: N/A  bpm MSD: 27  mm   7 w   5  d CRL:    mm    w    d                  [place] EDC: Subchorionic hemorrhage:  Small subchorionic hemorrhage. Maternal uterus/adnexae: Right ovary unremarkable. Left ovary not visualized. No other abnormality identified within the adnexa. No free fluid. IMPRESSION: 1. Single irregular intrauterine gestational sac containing irregular internal echogenic debris without definable  yolk sac or embryo. Mean sac diameter equals 27 mm. Findings meet definitive criteria for failed pregnancy. This follows SRU consensus guidelines: Diagnostic Criteria for Nonviable Pregnancy Early in the First Trimester. Alison Stalling J Med [phone]. 2. Small subchorionic hemorrhage. 3. No other acute abnormality identified within the pelvis. Electronically Signed   By: Jeannine Boga M.D.   On: 02/03/2017 23:51   MAU Course  Procedures  MDM   Assessment and Plan   1. Missed abortion   2. Vaginal bleeding in pregnancy, first trimester    DC home Comfort measures reviewed  Bleeding precautions RX: cytotec as directed, phenergan PRN, Ibuprofen 800mg , vicodin PRN #10  Return to MAU as needed FU with OB as planned  [facility] for [place] Follow up.   Specialty:  Obstetrics and Gynecology Why:  They will call you with an appointment to be seen in about 2 weeks.  Contact information: [place] [phone]           Mathis Bud 02/03/2017, 10:38 PM

## 2017-02-03 NOTE — MAU Note (Signed)
Pt presents complaining of heavy bleeding like a period that started today. Had some spotting the last 2 days and today has been passing clots. Has some cramping.

## 2017-02-04 DIAGNOSIS — O021 Missed abortion: Secondary | ICD-10-CM | POA: Diagnosis not present

## 2017-02-04 LAB — GC/CHLAMYDIA PROBE AMP (~~LOC~~) NOT AT ARMC
CHLAMYDIA, DNA PROBE: NEGATIVE
Neisseria Gonorrhea: NEGATIVE

## 2017-02-04 MED ORDER — MISOPROSTOL 200 MCG PO TABS: 800.0000 ug | ORAL_TABLET | Freq: Four times a day (QID) | ORAL | 1 refills | Status: DC

## 2017-02-04 MED ORDER — HYDROCODONE-ACETAMINOPHEN 5-325 MG PO TABS: 1.0000 | ORAL_TABLET | ORAL | 0 refills | Status: DC | PRN

## 2017-02-04 MED ORDER — PROMETHAZINE HCL 25 MG PO TABS: 12.5000 mg | ORAL_TABLET | Freq: Four times a day (QID) | ORAL | 0 refills | Status: DC | PRN

## 2017-02-04 MED ORDER — IBUPROFEN 800 MG PO TABS: 800.0000 mg | ORAL_TABLET | Freq: Three times a day (TID) | ORAL | 0 refills | Status: DC | PRN

## 2017-02-04 NOTE — Discharge Instructions (Signed)

## 2017-02-05 LAB — RPR: RPR Ser Ql: NONREACTIVE

## 2017-02-05 LAB — HIV ANTIBODY (ROUTINE TESTING W REFLEX): HIV SCREEN 4TH GENERATION: NONREACTIVE

## 2017-02-22 ENCOUNTER — Encounter: Payer: Self-pay | Admitting: Medical

## 2017-04-23 ENCOUNTER — Inpatient Hospital Stay (HOSPITAL_COMMUNITY): Payer: Managed Care, Other (non HMO)

## 2017-04-23 ENCOUNTER — Encounter: Payer: Self-pay | Admitting: Student

## 2017-04-23 ENCOUNTER — Inpatient Hospital Stay (HOSPITAL_COMMUNITY)
Admission: AD | Admit: 2017-04-23 | Discharge: 2017-04-23 | Disposition: A | Discharge status: Home / Self Care | Payer: Managed Care, Other (non HMO) | Source: Ambulatory Visit | Attending: Obstetrics & Gynecology | Admitting: Obstetrics & Gynecology

## 2017-04-23 DIAGNOSIS — O468X1 Other antepartum hemorrhage, first trimester: Secondary | ICD-10-CM

## 2017-04-23 DIAGNOSIS — Z3A01 Less than 8 weeks gestation of pregnancy: Secondary | ICD-10-CM | POA: Insufficient documentation

## 2017-04-23 DIAGNOSIS — B9689 Other specified bacterial agents as the cause of diseases classified elsewhere: Secondary | ICD-10-CM | POA: Insufficient documentation

## 2017-04-23 DIAGNOSIS — O209 Hemorrhage in early pregnancy, unspecified: Secondary | ICD-10-CM | POA: Insufficient documentation

## 2017-04-23 DIAGNOSIS — N760 Acute vaginitis: Secondary | ICD-10-CM | POA: Diagnosis not present

## 2017-04-23 DIAGNOSIS — Z3491 Encounter for supervision of normal pregnancy, unspecified, first trimester: Secondary | ICD-10-CM

## 2017-04-23 DIAGNOSIS — O418X10 Other specified disorders of amniotic fluid and membranes, first trimester, not applicable or unspecified: Secondary | ICD-10-CM

## 2017-04-23 LAB — CBC
HCT: 34.7 % — ABNORMAL LOW (ref 36.0–46.0)
HEMOGLOBIN: 11.8 g/dL — AB (ref 12.0–15.0)
MCH: 30.3 pg (ref 26.0–34.0)
MCHC: 34 g/dL (ref 30.0–36.0)
MCV: 89.2 fL (ref 78.0–100.0)
Platelets: 259 10*3/uL (ref 150–400)
RBC: 3.89 MIL/uL (ref 3.87–5.11)
RDW: 12.9 % (ref 11.5–15.5)
WBC: 12.4 10*3/uL — AB (ref 4.0–10.5)

## 2017-04-23 LAB — URINALYSIS, ROUTINE W REFLEX MICROSCOPIC
BACTERIA UA: NONE SEEN
Bilirubin Urine: NEGATIVE
Glucose, UA: NEGATIVE mg/dL
Ketones, ur: NEGATIVE mg/dL
Leukocytes, UA: NEGATIVE
NITRITE: NEGATIVE
PROTEIN: NEGATIVE mg/dL
Specific Gravity, Urine: 1.02 (ref 1.005–1.030)
pH: 5 (ref 5.0–8.0)

## 2017-04-23 LAB — WET PREP, GENITAL
Sperm: NONE SEEN
Trich, Wet Prep: NONE SEEN
YEAST WET PREP: NONE SEEN

## 2017-04-23 LAB — HCG, QUANTITATIVE, PREGNANCY: HCG, BETA CHAIN, QUANT, S: 138734 m[IU]/mL — AB (ref <5)

## 2017-04-23 LAB — POCT PREGNANCY, URINE: PREG TEST UR: POSITIVE — AB

## 2017-04-23 MED ORDER — METRONIDAZOLE 500 MG PO TABS: 500.0000 mg | ORAL_TABLET | Freq: Two times a day (BID) | ORAL | 0 refills | Status: DC

## 2017-04-23 NOTE — Discharge Instructions (Signed)
Bacterial Vaginosis °Bacterial vaginosis is a vaginal infection that occurs when the normal balance of bacteria in the vagina is disrupted. It results from an overgrowth of certain bacteria. This is the most common vaginal infection among women ages 15-44. °Because bacterial vaginosis increases your risk for STIs (sexually transmitted infections), getting treated can help reduce your risk for chlamydia, gonorrhea, herpes, and HIV (human immunodeficiency virus). Treatment is also important for preventing complications in pregnant women, because this condition can cause an early (premature) delivery. °What are the causes? °This condition is caused by an increase in harmful bacteria that are normally present in small amounts in the vagina. However, the reason that the condition develops is not fully understood. °What increases the risk? °The following factors may make you more likely to develop this condition: °· Having a new sexual partner or multiple sexual partners. °· Having unprotected sex. °· Douching. °· Having an intrauterine device (IUD). °· Smoking. °· Drug and alcohol abuse. °· Taking certain antibiotic medicines. °· Being pregnant. °You cannot get bacterial vaginosis from toilet seats, bedding, swimming pools, or contact with objects around you. °What are the signs or symptoms? °Symptoms of this condition include: °· Grey or white vaginal discharge. The discharge can also be watery or foamy. °· A fish-like odor with discharge, especially after sexual intercourse or during menstruation. °· Itching in and around the vagina. °· Burning or pain with urination. °Some women with bacterial vaginosis have no signs or symptoms. °How is this diagnosed? °This condition is diagnosed based on: °· Your medical history. °· A physical exam of the vagina. °· Testing a sample of vaginal fluid under a microscope to look for a large amount of bad bacteria or abnormal cells. Your health care provider may use a cotton swab or a  small wooden spatula to collect the sample. °How is this treated? °This condition is treated with antibiotics. These may be given as a pill, a vaginal cream, or a medicine that is put into the vagina (suppository). If the condition comes back after treatment, a second round of antibiotics may be needed. °Follow these instructions at home: °Medicines  °· Take over-the-counter and prescription medicines only as told by your health care provider. °· Take or use your antibiotic as told by your health care provider. Do not stop taking or using the antibiotic even if you start to feel better. °General instructions  °· If you have a female sexual partner, tell her that you have a vaginal infection. She should see her health care provider and be treated if she has symptoms. If you have a female sexual partner, he does not need treatment. °· During treatment: °¨ Avoid sexual activity until you finish treatment. °¨ Do not douche. °¨ Avoid alcohol as directed by your health care provider. °¨ Avoid breastfeeding as directed by your health care provider. °· Drink enough water and fluids to keep your urine clear or pale yellow. °· Keep the area around your vagina and rectum clean. °¨ Wash the area daily with warm water. °¨ Wipe yourself from front to back after using the toilet. °· Keep all follow-up visits as told by your health care provider. This is important. °How is this prevented? °· Do not douche. °· Wash the outside of your vagina with warm water only. °· Use protection when having sex. This includes latex condoms and dental dams. °· Limit how many sexual partners you have. To help prevent bacterial vaginosis, it is best to have sex with just one   partner (monogamous).  Make sure you and your sexual partner are tested for STIs.  Wear cotton or cotton-lined underwear.  Avoid wearing tight pants and pantyhose, especially during summer.  Limit the amount of alcohol that you drink.  Do not use any products that contain  nicotine or tobacco, such as cigarettes and e-cigarettes. If you need help quitting, ask your health care provider.  Do not use illegal drugs. Where to find more information:  Centers for Disease Control and Prevention: AppraiserFraud.fi  American Sexual Health Association (ASHA): www.ashastd.org  U.S. Department of Health and Financial controller, Office on Women's Health: DustingSprays.pl or SecuritiesCard.it Contact a health care provider if:  Your symptoms do not improve, even after treatment.  You have more discharge or pain when urinating.  You have a fever.  You have pain in your abdomen.  You have pain during sex.  You have vaginal bleeding between periods. Summary  Bacterial vaginosis is a vaginal infection that occurs when the normal balance of bacteria in the vagina is disrupted.  Because bacterial vaginosis increases your risk for STIs (sexually transmitted infections), getting treated can help reduce your risk for chlamydia, gonorrhea, herpes, and HIV (human immunodeficiency virus). Treatment is also important for preventing complications in pregnant women, because the condition can cause an early (premature) delivery.  This condition is treated with antibiotic medicines. These may be given as a pill, a vaginal cream, or a medicine that is put into the vagina (suppository). This information is not intended to replace advice given to you by your health care provider. Make sure you discuss any questions you have with your health care provider. Document Released: 11/16/2005 Document Revised: 08/01/2016 Document Reviewed: 08/01/2016 Elsevier Interactive Patient Education  2017 [place] Hematoma A subchorionic hematoma is a gathering of blood between the outer wall of the placenta and the inner wall of the womb (uterus). The placenta is the organ that connects the fetus to the wall of the uterus. The placenta performs  the feeding, breathing (oxygen to the fetus), and waste removal (excretory work) of the fetus. Subchorionic hematoma is the most common abnormality found on a result from ultrasonography done during the first trimester or early second trimester of pregnancy. If there has been little or no vaginal bleeding, early small hematomas usually shrink on their own and do not affect your baby or pregnancy. The blood is gradually absorbed over 1-2 weeks. When bleeding starts later in pregnancy or the hematoma is larger or occurs in an older pregnant woman, the outcome may not be as good. Larger hematomas may get bigger, which increases the chances for miscarriage. Subchorionic hematoma also increases the risk of premature detachment of the placenta from the uterus, preterm (premature) labor, and stillbirth. Follow these instructions at home:  Stay on bed rest if your health care provider recommends this. Although bed rest will not prevent more bleeding or prevent a miscarriage, your health care provider may recommend bed rest until you are advised otherwise.  Avoid heavy lifting (more than 10 lb [4.5 kg]), exercise, sexual intercourse, or douching as directed by your health care provider.  Keep track of the number of pads you use each day and how soaked (saturated) they are. Write down this information.  Do not use tampons.  Keep all follow-up appointments as directed by your health care provider. Your health care provider may ask you to have follow-up blood tests or ultrasound tests or both. Get help right away if:  You have severe cramps  in your stomach, back, abdomen, or pelvis.  You have a fever.  You pass large clots or tissue. Save any tissue for your health care provider to look at.  Your bleeding increases or you become lightheaded, feel weak, or have fainting episodes. This information is not intended to replace advice given to you by your health care provider. Make sure you discuss any questions  you have with your health care provider. Document Released: 03/03/2007 Document Revised: 04/23/2016 Document Reviewed: 06/15/2013 Elsevier Interactive Patient Education  2017 [employer].

## 2017-04-23 NOTE — MAU Provider Note (Signed)
History     CSN: [id number]  Arrival date and time: 04/23/17 1714  First Provider Initiated Contact with Patient 04/23/17 1822      Chief Complaint  Patient presents with  . Vaginal Bleeding   HPI Brandi Castillo is a 26 y.o. G3P1011 at [redacted]w[redacted]d by LMP who presents with vaginal bleeding & abdominal cramping. Reports vaginal bleeding off & on for the last week. Since today has been bright red with few small clots. Not saturating pads. Lower abdominal pain is sharp & intermittent. Denies pain at this time. Endorses nausea; no vomiting today. Denies dysuria, vaginal discharge, diarrhea, or constipation, no recent intercourse. Has not started prenatal care.   OB History    Gravida Para Term Preterm AB Living   3 1 1  0 1 1   SAB TAB Ectopic Multiple Live Births   1 0 0 0 1      Past Medical History:  Diagnosis Date  . Anxiety   . Bacterial infection   . H/O varicella     No past surgical history on file.  Family History  Problem Relation Age of Onset  . Asthma Mother   . Depression Mother   . Alcohol abuse Mother   . Alcohol abuse Father   . Asthma Sister   . Asthma Brother   . Diabetes Maternal Grandmother   . Alcohol abuse Maternal Grandmother   . Hypertension Maternal Grandfather   . Alcohol abuse Maternal Grandfather   . Alcohol abuse Paternal Grandmother   . Diabetes Paternal Grandfather   . Alcohol abuse Paternal Grandfather     Social History  Substance Use Topics  . Smoking status: Never Smoker  . Smokeless tobacco: Never Used  . Alcohol use No    Allergies:  Allergies  Allergen Reactions  . Sulfa Drugs Cross Reactors Rash    Prescriptions Prior to Admission  Medication Sig Dispense Refill Last Dose  . HYDROcodone-acetaminophen (NORCO/VICODIN) 5-325 MG tablet Take 1-2 tablets by mouth every 4 (four) hours as needed. 10 tablet 0   . ibuprofen (ADVIL,MOTRIN) 800 MG tablet Take 1 tablet (800 mg total) by mouth every 8 (eight) hours as needed. 30 tablet  0   . misoprostol (CYTOTEC) 200 MCG tablet Take 4 tablets (800 mcg total) by mouth 4 (four) times daily. 4 tablet 1   . Prenatal Vit-Fe Fumarate-FA (PRENATAL MULTIVITAMIN) TABS Take 1 tablet by mouth daily.     Taking  . promethazine (PHENERGAN) 25 MG tablet Take 0.5-1 tablets (12.5-25 mg total) by mouth every 6 (six) hours as needed. 30 tablet 0     Review of Systems  Constitutional: Negative.   Gastrointestinal: Positive for abdominal pain and nausea. Negative for constipation, diarrhea and vomiting.  Genitourinary: Positive for vaginal bleeding. Negative for dyspareunia, dysuria and vaginal discharge.   Physical Exam   Blood pressure 124/82, pulse 98, temperature 98.2 F (36.8 C), temperature source Oral, resp. rate 16, weight 152 lb 0.6 oz (69 kg), last menstrual period 03/09/2017, SpO2 100 %, unknown if currently breastfeeding.  Physical Exam  Nursing note and vitals reviewed. Constitutional: She is oriented to person, place, and time. She appears well-developed and well-nourished. No distress.  HENT:  Head: Normocephalic and atraumatic.  Eyes: Conjunctivae are normal. Right eye exhibits no discharge. Left eye exhibits no discharge. No scleral icterus.  Neck: Normal range of motion.  Respiratory: Effort normal. No respiratory distress.  GI: Soft. She exhibits no distension. There is no tenderness. There is no rebound and no  guarding.  Genitourinary: Uterus normal. Cervix exhibits no motion tenderness and no friability. Right adnexum displays no mass and no tenderness. Left adnexum displays no mass and no tenderness. There is bleeding (small amount of rust colored blood at cervical os, no active bleeding) in the vagina.  Genitourinary Comments: Cervix closed  Neurological: She is alert and oriented to person, place, and time.  Skin: Skin is warm and dry. She is not diaphoretic.  Psychiatric: She has a normal mood and affect. Her behavior is normal. Judgment and thought content  normal.    MAU Course  Procedures Results for orders placed or performed during the hospital encounter of 04/23/17 (from the past 24 hour(s))  Urinalysis, Routine w reflex microscopic     Status: Abnormal   Collection Time: 04/23/17  5:55 PM  Result Value Ref Range   Color, Urine YELLOW YELLOW   APPearance CLEAR CLEAR   Specific Gravity, Urine 1.020 1.005 - 1.030   pH 5.0 5.0 - 8.0   Glucose, UA NEGATIVE NEGATIVE mg/dL   Hgb urine dipstick SMALL (A) NEGATIVE   Bilirubin Urine NEGATIVE NEGATIVE   Ketones, ur NEGATIVE NEGATIVE mg/dL   Protein, ur NEGATIVE NEGATIVE mg/dL   Nitrite NEGATIVE NEGATIVE   Leukocytes, UA NEGATIVE NEGATIVE   RBC / HPF 0-5 0 - 5 RBC/hpf   WBC, UA 0-5 0 - 5 WBC/hpf   Bacteria, UA NONE SEEN NONE SEEN   Squamous Epithelial / LPF 0-5 (A) NONE SEEN   Mucous PRESENT   Pregnancy, urine POC     Status: Abnormal   Collection Time: 04/23/17  6:01 PM  Result Value Ref Range   Preg Test, Ur POSITIVE (A) NEGATIVE    MDM +UPT UA, wet prep, GC/chlamydia, CBC, ABO/Rh, quant hCG, HIV, and [place] today to rule out ectopic pregnancy A positive Ultrasound shows SIUP with cardiac activity & [place] (per prelim)  Assessment and Plan  A: 1. Normal IUP (intrauterine pregnancy) on prenatal ultrasound, first trimester   2. Vaginal bleeding in pregnancy, first trimester   3. Subchorionic hematoma in first trimester, single or unspecified fetus   4. BV (bacterial vaginosis)    P: Discharge home Rx flagyl Discussed reasons to return to MAU Start prenatal care GC/CT pending   [name] 04/23/2017, 6:22 PM

## 2017-04-23 NOTE — Progress Notes (Signed)
Erin Lawrence NP in earlier to discuss test results and d/c plan. Written and verbal d/c instructions given and understanding voiced 

## 2017-04-23 NOTE — MAU Note (Signed)
No pain currently--intermittent sharp abdominal pain since vaginal bleeding started back.  Cycle ended 4/13. States had a +HPT at the end of April. States started having more vaginal spotting from 5/18-5/22. Bleeding stopped and then restarted today. Dark red in color with dime size clots. Having to wear a panty liner.   +nausea

## 2017-04-23 NOTE — Progress Notes (Addendum)
G3P1 @ ? Here dt bleediing "dark; soft tissue, dime size. Noticed when wiped". States bled on 18, spotted for next 4 days, and today dark bleeding with soft tissue noted when wiped on toilet paper "like when I miscarried".   Provider at bs assessing. Wet prep and GC done. Pelvic exam done as well.

## 2017-04-27 LAB — GC/CHLAMYDIA PROBE AMP (~~LOC~~) NOT AT ARMC
Chlamydia: NEGATIVE
Neisseria Gonorrhea: NEGATIVE

## 2017-06-21 ENCOUNTER — Ambulatory Visit (INDEPENDENT_AMBULATORY_CARE_PROVIDER_SITE_OTHER): Payer: Managed Care, Other (non HMO) | Admitting: Certified Nurse Midwife

## 2017-06-21 ENCOUNTER — Encounter: Payer: Self-pay | Admitting: Certified Nurse Midwife

## 2017-06-21 ENCOUNTER — Other Ambulatory Visit (HOSPITAL_COMMUNITY)
Admission: RE | Admit: 2017-06-21 | Discharge: 2017-06-21 | Disposition: A | Discharge status: Home / Self Care | Payer: Managed Care, Other (non HMO) | Source: Ambulatory Visit | Attending: Certified Nurse Midwife | Admitting: Certified Nurse Midwife

## 2017-06-21 VITALS — BP 107/71 | HR 81 | Wt 147.5 lb

## 2017-06-21 DIAGNOSIS — Z331 Pregnant state, incidental: Secondary | ICD-10-CM | POA: Diagnosis not present

## 2017-06-21 DIAGNOSIS — Z3480 Encounter for supervision of other normal pregnancy, unspecified trimester: Secondary | ICD-10-CM | POA: Diagnosis not present

## 2017-06-21 DIAGNOSIS — Z113 Encounter for screening for infections with a predominantly sexual mode of transmission: Secondary | ICD-10-CM

## 2017-06-21 DIAGNOSIS — O219 Vomiting of pregnancy, unspecified: Secondary | ICD-10-CM

## 2017-06-21 DIAGNOSIS — I8390 Asymptomatic varicose veins of unspecified lower extremity: Secondary | ICD-10-CM

## 2017-06-21 DIAGNOSIS — Z803 Family history of malignant neoplasm of breast: Secondary | ICD-10-CM

## 2017-06-21 DIAGNOSIS — Z3482 Encounter for supervision of other normal pregnancy, second trimester: Secondary | ICD-10-CM

## 2017-06-21 DIAGNOSIS — A499 Bacterial infection, unspecified: Secondary | ICD-10-CM | POA: Insufficient documentation

## 2017-06-21 MED ORDER — DOXYLAMINE-PYRIDOXINE ER 20-20 MG PO TBCR: 1.0000 | EXTENDED_RELEASE_TABLET | Freq: Two times a day (BID) | ORAL | 6 refills | Status: DC

## 2017-06-21 MED ORDER — MEDICAL COMPRESSION SOCKS MISC: 1.0000 [IU] | Freq: Every day | 0 refills | Status: DC

## 2017-06-21 NOTE — Progress Notes (Signed)
Subjective:    Brandi Castillo is being seen today for her first obstetrical visit.  This is a planned pregnancy. She is at [redacted]w[redacted]d gestation. Her obstetrical history is significant for none. Relationship with FOB: significant other, living together. Patient does intend to breast feed. Pregnancy history fully reviewed.  The information documented in the HPI was reviewed and verified.  Menstrual History: OB History    Gravida Para Term Preterm AB Living   3 1 1  0 1 1   SAB TAB Ectopic Multiple Live Births   1 0 0 0 1       Patient's last menstrual period was 03/07/2017.    Past Medical History:  Diagnosis Date  . Anxiety   . Bacterial infection   . H/O varicella     History reviewed. No pertinent surgical history.   (Not in a hospital admission) Allergies  Allergen Reactions  . Sulfa Drugs Cross Reactors Rash    Social History  Substance Use Topics  . Smoking status: Never Smoker  . Smokeless tobacco: Never Used  . Alcohol use No    Family History  Problem Relation Age of Onset  . Asthma Mother   . Depression Mother   . Alcohol abuse Mother   . Alcohol abuse Father   . Asthma Sister   . Asthma Brother   . Diabetes Maternal Grandmother   . Alcohol abuse Maternal Grandmother   . Hypertension Maternal Grandfather   . Alcohol abuse Maternal Grandfather   . Alcohol abuse Paternal Grandmother   . Diabetes Paternal Grandfather   . Alcohol abuse Paternal Grandfather      Review of Systems Constitutional: negative for weight loss Gastrointestinal: + for nausea & vomiting Genitourinary:negative for genital lesions and vaginal discharge and dysuria Musculoskeletal:negative for back pain Behavioral/Psych: negative for abusive relationship, depression, illegal drug usage and tobacco use    Objective:    BP 107/71   Pulse 81   Wt 147 lb 8 oz (66.9 kg)   LMP 03/07/2017   BMI 29.79 kg/m  General Appearance:    Alert, cooperative, no distress, appears stated age   Head:    Normocephalic, without obvious abnormality, atraumatic  Eyes:    PERRL, conjunctiva/corneas clear, EOM's intact, fundi    benign, both eyes  Ears:    Normal TM's and external ear canals, both ears  Nose:   Nares normal, septum midline, mucosa normal, no drainage    or sinus tenderness  Throat:   Lips, mucosa, and tongue normal; teeth and gums normal  Neck:   Supple, symmetrical, trachea midline, no adenopathy;    thyroid:  no enlargement/tenderness/nodules; no carotid   bruit or JVD  Back:     Symmetric, no curvature, ROM normal, no CVA tenderness  Lungs:     Clear to auscultation bilaterally, respirations unlabored  Chest Wall:    No tenderness or deformity   Heart:    Regular rate and rhythm, S1 and S2 normal, no murmur, rub   or gallop  Breast Exam:    No tenderness, masses, or nipple abnormality  Abdomen:     Soft, non-tender, bowel sounds active all four quadrants,    no masses, no organomegaly  Genitalia:    Normal female without lesion, discharge or tenderness  Extremities:   Extremities normal, atraumatic, no cyanosis or edema, + varicose veins bilaterally lower extremities and on thighs  Pulses:   2+ and symmetric all extremities  Skin:   Skin color, texture, turgor normal, no rashes  or lesions  Lymph nodes:   Cervical, supraclavicular, and axillary nodes normal  Neurologic:   CNII-XII intact, normal strength, sensation and reflexes    throughout                Cervix: long, thick, closed and posterior.  FHR: 158 by doppler.  Dates c/w early [place] and LMP.      Lab Review Urine pregnancy test Labs reviewed yes Radiologic studies reviewed yes  Assessment & Plan    Pregnancy at [redacted]w[redacted]d weeks      1. Supervision of other normal pregnancy, antepartum   - Cytology - PAP - Cervicovaginal ancillary only - Hemoglobinopathy evaluation - Varicella zoster antibody, IgG - VITAMIN D 25 Hydroxy (Vit-D Deficiency, Fractures) - Culture, OB Urine - Obstetric Panel, Including  HIV - Hemoglobin A1c - [place] MFM OB COMP + 14 WK; Future - Inheritest Society Guided  2. Nausea and vomiting during pregnancy prior to [redacted] weeks gestation   - Doxylamine-Pyridoxine ER (BONJESTA) 20-20 MG TBCR; Take 1 tablet by mouth 2 (two) times daily.  Dispense: 60 tablet; Refill: 6  3. Varicose vein of leg   - Elastic Bandages & Supports (MEDICAL COMPRESSION SOCKS) MISC; 1 Units by Does not apply route daily.  Dispense: 2 each; Refill: 0  4. Family history of breast cancer    Prenatal vitamins.  Counseling provided regarding continued use of seat belts, cessation of alcohol consumption, smoking or use of illicit drugs; infection precautions i.e., influenza/TDAP immunizations, toxoplasmosis,CMV, parvovirus, listeria and varicella; workplace safety, exercise during pregnancy; routine dental care, safe medications, sexual activity, hot tubs, saunas, pools, travel, caffeine use, fish and methlymercury, potential toxins, hair treatments, varicose veins Weight gain recommendations per IOM guidelines reviewed: underweight/BMI< 18.5--> gain 28 - 40 lbs; normal weight/BMI 18.5 - 24.9--> gain 25 - 35 lbs; overweight/BMI 25 - 29.9--> gain 15 - 25 lbs; obese/BMI >30->gain  11 - 20 lbs Problem list reviewed and updated. FIRST/CF mutation testing/NIPT/QUAD SCREEN/fragile X/Ashkenazi Jewish population testing/Spinal muscular atrophy discussed: declined. Role of ultrasound in pregnancy discussed; fetal survey: ordered. Amniocentesis discussed: not indicated.  Meds ordered this encounter  Medications  . Doxylamine-Pyridoxine ER (BONJESTA) 20-20 MG TBCR    Sig: Take 1 tablet by mouth 2 (two) times daily.    Dispense:  60 tablet    Refill:  6  . Elastic Bandages & Supports (MEDICAL COMPRESSION SOCKS) MISC    Sig: 1 Units by Does not apply route daily.    Dispense:  2 each    Refill:  0   Orders Placed This Encounter  Procedures  . Culture, OB Urine  . [place] MFM OB COMP + 14 WK    Standing Status:    Future    Standing Expiration Date:   08/22/2018    Order Specific Question:   Reason for Exam (SYMPTOM  OR DIAGNOSIS REQUIRED)    Answer:   fetal anatomy scan    Order Specific Question:   Preferred imaging location?    Answer:   [facility]  . Hemoglobinopathy evaluation  . Varicella zoster antibody, IgG  . VITAMIN D 25 Hydroxy (Vit-D Deficiency, Fractures)  . Obstetric Panel, Including HIV  . Hemoglobin A1c  . Inheritest Society Guided    Follow up in 4 weeks. 50% of 30 min visit spent on counseling and coordination of care.

## 2017-06-21 NOTE — Progress Notes (Signed)
Pt was given Phergan for N&V, can't take due to drowsiness. ? New Rx for N&V. Pt states she has pain in legs due to increase veins. Pt states some chest pain, ? Anxiety related.

## 2017-06-22 LAB — CERVICOVAGINAL ANCILLARY ONLY
CANDIDA VAGINITIS: NEGATIVE
Chlamydia: NEGATIVE
NEISSERIA GONORRHEA: NEGATIVE
TRICH (WINDOWPATH): NEGATIVE

## 2017-06-23 LAB — URINE CULTURE, OB REFLEX

## 2017-06-23 LAB — OBSTETRIC PANEL, INCLUDING HIV
ANTIBODY SCREEN: NEGATIVE
BASOS: 0 %
Basophils Absolute: 0 10*3/uL (ref 0.0–0.2)
EOS (ABSOLUTE): 0.1 10*3/uL (ref 0.0–0.4)
EOS: 1 %
HEMATOCRIT: 34.8 % (ref 34.0–46.6)
HEMOGLOBIN: 11.3 g/dL (ref 11.1–15.9)
HIV SCREEN 4TH GENERATION: NONREACTIVE
Hepatitis B Surface Ag: NEGATIVE
Immature Grans (Abs): 0.1 10*3/uL (ref 0.0–0.1)
Immature Granulocytes: 1 %
LYMPHS ABS: 2.2 10*3/uL (ref 0.7–3.1)
Lymphs: 21 %
MCH: 29.7 pg (ref 26.6–33.0)
MCHC: 32.5 g/dL (ref 31.5–35.7)
MCV: 91 fL (ref 79–97)
MONOS ABS: 0.8 10*3/uL (ref 0.1–0.9)
Monocytes: 7 %
NEUTROS PCT: 70 %
Neutrophils Absolute: 7.7 10*3/uL — ABNORMAL HIGH (ref 1.4–7.0)
Platelets: 267 10*3/uL (ref 150–379)
RBC: 3.81 x10E6/uL (ref 3.77–5.28)
RDW: 14 % (ref 12.3–15.4)
RH TYPE: POSITIVE
RPR Ser Ql: NONREACTIVE
Rubella Antibodies, IGG: <0.9 index — ABNORMAL LOW (ref >0.99)
WBC: 10.8 10*3/uL (ref 3.4–10.8)

## 2017-06-23 LAB — VITAMIN D 25 HYDROXY (VIT D DEFICIENCY, FRACTURES): Vit D, 25-Hydroxy: 26.3 ng/mL — ABNORMAL LOW (ref 30.0–100.0)

## 2017-06-23 LAB — VARICELLA ZOSTER ANTIBODY, IGG

## 2017-06-23 LAB — HEMOGLOBINOPATHY EVALUATION
HEMOGLOBIN A2 QUANTITATION: 2.8 % (ref 1.8–3.2)
HEMOGLOBIN F QUANTITATION: 0 % (ref 0.0–2.0)
HGB C: 0 %
HGB S: 0 %
HGB VARIANT: 0 %
Hgb A: 97.2 % (ref 96.4–98.8)

## 2017-06-23 LAB — CYTOLOGY - PAP: Diagnosis: NEGATIVE

## 2017-06-23 LAB — CULTURE, OB URINE

## 2017-06-23 LAB — HEMOGLOBIN A1C
Est. average glucose Bld gHb Est-mCnc: 97 mg/dL
Hgb A1c MFr Bld: 5 % (ref 4.8–5.6)

## 2017-06-24 ENCOUNTER — Other Ambulatory Visit: Payer: Self-pay | Admitting: Certified Nurse Midwife

## 2017-06-24 DIAGNOSIS — R7989 Other specified abnormal findings of blood chemistry: Secondary | ICD-10-CM | POA: Insufficient documentation

## 2017-06-24 DIAGNOSIS — Z3480 Encounter for supervision of other normal pregnancy, unspecified trimester: Secondary | ICD-10-CM

## 2017-06-24 DIAGNOSIS — O09899 Supervision of other high risk pregnancies, unspecified trimester: Secondary | ICD-10-CM | POA: Insufficient documentation

## 2017-06-24 DIAGNOSIS — O9989 Other specified diseases and conditions complicating pregnancy, childbirth and the puerperium: Secondary | ICD-10-CM

## 2017-06-24 DIAGNOSIS — Z283 Underimmunization status: Secondary | ICD-10-CM

## 2017-06-24 DIAGNOSIS — Z2839 Other underimmunization status: Secondary | ICD-10-CM | POA: Insufficient documentation

## 2017-06-24 MED ORDER — VITAMIN D (ERGOCALCIFEROL) 1.25 MG (50000 UNIT) PO CAPS: 50000.0000 [IU] | ORAL_CAPSULE | ORAL | 2 refills | Status: DC

## 2017-07-05 ENCOUNTER — Other Ambulatory Visit: Payer: Self-pay | Admitting: Certified Nurse Midwife

## 2017-07-05 DIAGNOSIS — Z3480 Encounter for supervision of other normal pregnancy, unspecified trimester: Secondary | ICD-10-CM

## 2017-07-05 LAB — INHERITEST SOCIETY GUIDED

## 2017-07-16 ENCOUNTER — Ambulatory Visit (HOSPITAL_COMMUNITY)
Admission: RE | Admit: 2017-07-16 | Discharge: 2017-07-16 | Disposition: A | Discharge status: Home / Self Care | Payer: Managed Care, Other (non HMO) | Source: Ambulatory Visit | Attending: Certified Nurse Midwife | Admitting: Certified Nurse Midwife

## 2017-07-16 ENCOUNTER — Other Ambulatory Visit: Payer: Self-pay | Admitting: Certified Nurse Midwife

## 2017-07-16 DIAGNOSIS — Z3480 Encounter for supervision of other normal pregnancy, unspecified trimester: Secondary | ICD-10-CM

## 2017-07-16 DIAGNOSIS — Z3A18 18 weeks gestation of pregnancy: Secondary | ICD-10-CM | POA: Insufficient documentation

## 2017-07-16 DIAGNOSIS — Z3689 Encounter for other specified antenatal screening: Secondary | ICD-10-CM

## 2017-07-19 ENCOUNTER — Encounter: Payer: Self-pay | Admitting: *Deleted

## 2017-07-19 ENCOUNTER — Ambulatory Visit (INDEPENDENT_AMBULATORY_CARE_PROVIDER_SITE_OTHER): Payer: Managed Care, Other (non HMO) | Admitting: Certified Nurse Midwife

## 2017-07-19 VITALS — BP 109/69 | HR 83 | Wt 152.2 lb

## 2017-07-19 DIAGNOSIS — O09892 Supervision of other high risk pregnancies, second trimester: Secondary | ICD-10-CM

## 2017-07-19 DIAGNOSIS — O9989 Other specified diseases and conditions complicating pregnancy, childbirth and the puerperium: Secondary | ICD-10-CM

## 2017-07-19 DIAGNOSIS — R7989 Other specified abnormal findings of blood chemistry: Secondary | ICD-10-CM

## 2017-07-19 DIAGNOSIS — Z2839 Other underimmunization status: Secondary | ICD-10-CM

## 2017-07-19 DIAGNOSIS — Z283 Underimmunization status: Secondary | ICD-10-CM

## 2017-07-19 DIAGNOSIS — Z3480 Encounter for supervision of other normal pregnancy, unspecified trimester: Secondary | ICD-10-CM

## 2017-07-19 DIAGNOSIS — O09899 Supervision of other high risk pregnancies, unspecified trimester: Secondary | ICD-10-CM

## 2017-07-19 DIAGNOSIS — E559 Vitamin D deficiency, unspecified: Secondary | ICD-10-CM

## 2017-07-19 NOTE — Progress Notes (Signed)
   PRENATAL VISIT NOTE  Subjective:  Brandi Castillo is a 26 y.o. G3P1011 at 3w1dbeing seen today for ongoing prenatal care.  She is currently monitored for the following issues for this low-risk pregnancy and has History of anxiety; Family history of breast cancer; Supervision of other normal pregnancy, antepartum; Low vitamin D level; Rubella non-immune status, antepartum; and Maternal varicella, non-immune on her problem list.  Patient reports no bleeding, no contractions, no cramping, no leaking and occasional bouts of dizziness and nausea, hot feeling while at work, frequent breaks encouraged.  Contractions: Not present. Vag. Bleeding: None.  Movement: Present. Denies leaking of fluid.   The following portions of the patient's history were reviewed and updated as appropriate: allergies, current medications, past family history, past medical history, past social history, past surgical history and problem list. Problem list updated.  Objective:   Vitals:   07/19/17 0817  BP: 109/69  Pulse: 83  Weight: 152 lb 3.2 oz (69 kg)    Fetal Status: Fetal Heart Rate (bpm): 136; doppler Fundal Height: 20 cm Movement: Present     General:  Alert, oriented and cooperative. Patient is in no acute distress.  Skin: Skin is warm and dry. No rash noted.   Cardiovascular: Normal heart rate noted  Respiratory: Normal respiratory effort, no problems with respiration noted  Abdomen: Soft, gravid, appropriate for gestational age.  Pain/Pressure: Absent     Pelvic: Cervical exam deferred        Extremities: Normal range of motion.  Edema: None  Mental Status:  Normal mood and affect. Normal behavior. Normal judgment and thought content.   Assessment and Plan:  Pregnancy: G3P1011 at 146w1d1. Supervision of other normal pregnancy, antepartum     Doing well - MaterniT21 PLUS Core+SCA - AFP, Serum, Open Spina Bifida  2. Low vitamin D level     Taking weekly vitamin D  3. Rubella non-immune  status, antepartum     MMR posptarum  4. Maternal varicella, non-immune     Varicella postpartum  Preterm labor symptoms and general obstetric precautions including but not limited to vaginal bleeding, contractions, leaking of fluid and fetal movement were reviewed in detail with the patient. Please refer to After Visit Summary for other counseling recommendations.  Return in about 8 weeks (around 09/13/2017) for ROB, babyscripts.   RaMorene CrockerCNM

## 2017-07-19 NOTE — Progress Notes (Signed)
Patient reports good fetal movement, denies pain. Pt's states that her phone is currently not in service so she was unaware of lab results and rx sent.

## 2017-07-20 ENCOUNTER — Other Ambulatory Visit: Payer: Self-pay | Admitting: Certified Nurse Midwife

## 2017-07-22 ENCOUNTER — Other Ambulatory Visit: Payer: Self-pay | Admitting: Certified Nurse Midwife

## 2017-07-22 DIAGNOSIS — Z3480 Encounter for supervision of other normal pregnancy, unspecified trimester: Secondary | ICD-10-CM

## 2017-07-22 LAB — AFP, SERUM, OPEN SPINA BIFIDA
AFP MOM: 0.81
AFP Value: 39.4 ng/mL
Gest. Age on Collection Date: 19.1 weeks
MATERNAL AGE AT EDD: 24.9 a
OSBR RISK 1 IN: 10000
TEST RESULTS AFP: NEGATIVE
WEIGHT: 154 [lb_av]

## 2017-07-24 LAB — MATERNIT21 PLUS CORE+SCA
CHROMOSOME 13: NEGATIVE
Chromosome 18: NEGATIVE
Chromosome 21: NEGATIVE
Y Chromosome: NOT DETECTED

## 2017-07-27 ENCOUNTER — Other Ambulatory Visit: Payer: Self-pay | Admitting: Certified Nurse Midwife

## 2017-07-27 DIAGNOSIS — Z3480 Encounter for supervision of other normal pregnancy, unspecified trimester: Secondary | ICD-10-CM

## 2017-07-29 ENCOUNTER — Other Ambulatory Visit: Payer: Self-pay | Admitting: Certified Nurse Midwife

## 2017-08-26 ENCOUNTER — Inpatient Hospital Stay (HOSPITAL_COMMUNITY)
Admission: AD | Admit: 2017-08-26 | Discharge: 2017-08-26 | Disposition: A | Discharge status: Home / Self Care | Payer: Managed Care, Other (non HMO) | Source: Ambulatory Visit | Attending: Obstetrics & Gynecology | Admitting: Obstetrics & Gynecology

## 2017-08-26 DIAGNOSIS — O26892 Other specified pregnancy related conditions, second trimester: Secondary | ICD-10-CM | POA: Diagnosis not present

## 2017-08-26 DIAGNOSIS — Z3480 Encounter for supervision of other normal pregnancy, unspecified trimester: Secondary | ICD-10-CM

## 2017-08-26 DIAGNOSIS — R51 Headache: Secondary | ICD-10-CM | POA: Diagnosis not present

## 2017-08-26 DIAGNOSIS — Z3A24 24 weeks gestation of pregnancy: Secondary | ICD-10-CM | POA: Diagnosis not present

## 2017-08-26 LAB — URINALYSIS, ROUTINE W REFLEX MICROSCOPIC
Bacteria, UA: NONE SEEN
Bilirubin Urine: NEGATIVE
GLUCOSE, UA: NEGATIVE mg/dL
Ketones, ur: NEGATIVE mg/dL
LEUKOCYTES UA: NEGATIVE
NITRITE: NEGATIVE
PH: 5 (ref 5.0–8.0)
Protein, ur: NEGATIVE mg/dL
SPECIFIC GRAVITY, URINE: 1.018 (ref 1.005–1.030)

## 2017-08-26 NOTE — MAU Note (Signed)
Pt reports she had been having spells where she feels really weak and dehydrated. Has been having headache for a while and they are getting worse.

## 2017-09-13 ENCOUNTER — Ambulatory Visit (INDEPENDENT_AMBULATORY_CARE_PROVIDER_SITE_OTHER): Payer: Managed Care, Other (non HMO) | Admitting: Obstetrics and Gynecology

## 2017-09-13 VITALS — BP 109/70 | HR 97 | Wt 157.7 lb

## 2017-09-13 DIAGNOSIS — Z23 Encounter for immunization: Secondary | ICD-10-CM

## 2017-09-13 DIAGNOSIS — Z3490 Encounter for supervision of normal pregnancy, unspecified, unspecified trimester: Secondary | ICD-10-CM

## 2017-09-13 DIAGNOSIS — Z3480 Encounter for supervision of other normal pregnancy, unspecified trimester: Secondary | ICD-10-CM

## 2017-09-13 DIAGNOSIS — Z283 Underimmunization status: Secondary | ICD-10-CM

## 2017-09-13 DIAGNOSIS — Z3482 Encounter for supervision of other normal pregnancy, second trimester: Secondary | ICD-10-CM

## 2017-09-13 DIAGNOSIS — R000 Tachycardia, unspecified: Secondary | ICD-10-CM

## 2017-09-13 DIAGNOSIS — Z2839 Other underimmunization status: Secondary | ICD-10-CM

## 2017-09-13 DIAGNOSIS — O9989 Other specified diseases and conditions complicating pregnancy, childbirth and the puerperium: Principal | ICD-10-CM

## 2017-09-13 NOTE — Progress Notes (Signed)
Subjective:  Brandi Castillo is a 26 y.o. G3P1011 at [redacted]w[redacted]d being seen today for ongoing prenatal care.  She is currently monitored for the following issues for this low-risk pregnancy and has History of anxiety; Family history of breast cancer; Supervision of other normal pregnancy, antepartum; Low vitamin D level; Rubella non-immune status, antepartum; and Maternal varicella, non-immune on her problem list.  Patient reports having speels where she feels as if going to black out. She has not blacked out however. She becomes diaphoretic, clamly and increased heart rate just prior to. She lays down and Sx resolve with time. Reports staying hydrated and eat ing well. Reports having sinilar Sx prior to pregnancy and UC told her it was anxiety. .  Contractions: Not present. Vag. Bleeding: None.  Movement: Present. Denies leaking of fluid.   The following portions of the patient's history were reviewed and updated as appropriate: allergies, current medications, past family history, past medical history, past social history, past surgical history and problem list. Problem list updated.  Objective:   Vitals:   09/13/17 0816  BP: 109/70  Pulse: 97  Weight: 157 lb 11.2 oz (71.5 kg)    Fetal Status:     Movement: Present     General:  Alert, oriented and cooperative. Patient is in no acute distress.  Skin: Skin is warm and dry. No rash noted.   Cardiovascular: Normal heart rate noted  Respiratory: Normal respiratory effort, no problems with respiration noted  Abdomen: Soft, gravid, appropriate for gestational age. Pain/Pressure: Absent     Pelvic:  Cervical exam deferred        Extremities: Normal range of motion.  Edema: None  Mental Status: Normal mood and affect. Normal behavior. Normal judgment and thought content.   Urinalysis:      Assessment and Plan:  Pregnancy: G3P1011 at [redacted]w[redacted]d  1. Supervision of other normal pregnancy, antepartum Stable No fasting today Will schedule Glucola  this week Declines flu vaccine Tdap today Will refer to cardiology for work up   2. Rubella non-immune status, antepartum Vaccine PP  Preterm labor symptoms and general obstetric precautions including but not limited to vaginal bleeding, contractions, leaking of fluid and fetal movement were reviewed in detail with the patient. Please refer to After Visit Summary for other counseling recommendations.  Return in about 2 weeks (around 09/27/2017) for OB visit.   Chancy Milroy, MD

## 2017-09-13 NOTE — Progress Notes (Signed)
Pt complains of having black out spells. Sx started after last visit.

## 2017-09-13 NOTE — Addendum Note (Signed)
Addended by: Chancy Milroy on: 09/13/2017 08:44 AM   Modules accepted: Orders

## 2017-09-13 NOTE — Patient Instructions (Signed)
Third Trimester of Pregnancy The third trimester is from week 28 through week 40 (months 7 through 9). The third trimester is a time when the unborn baby (fetus) is growing rapidly. At the end of the ninth month, the fetus is about 20 inches in length and weighs 6-10 pounds. Body changes during your third trimester Your body will continue to go through many changes during pregnancy. The changes vary from woman to woman. During the third trimester:  Your weight will continue to increase. You can expect to gain 25-35 pounds (11-16 kg) by the end of the pregnancy.  You may begin to get stretch marks on your hips, abdomen, and breasts.  You may urinate more often because the fetus is moving lower into your pelvis and pressing on your bladder.  You may develop or continue to have heartburn. This is caused by increased hormones that slow down muscles in the digestive tract.  You may develop or continue to have constipation because increased hormones slow digestion and cause the muscles that push waste through your intestines to relax.  You may develop hemorrhoids. These are swollen veins (varicose veins) in the rectum that can itch or be painful.  You may develop swollen, bulging veins (varicose veins) in your legs.  You may have increased body aches in the pelvis, back, or thighs. This is due to weight gain and increased hormones that are relaxing your joints.  You may have changes in your hair. These can include thickening of your hair, rapid growth, and changes in texture. Some women also have hair loss during or after pregnancy, or hair that feels dry or thin. Your hair will most likely return to normal after your baby is born.  Your breasts will continue to grow and they will continue to become tender. A yellow fluid (colostrum) may leak from your breasts. This is the first milk you are producing for your baby.  Your belly button may stick out.  You may notice more swelling in your hands,  face, or ankles.  You may have increased tingling or numbness in your hands, arms, and legs. The skin on your belly may also feel numb.  You may feel short of breath because of your expanding uterus.  You may have more problems sleeping. This can be caused by the size of your belly, increased need to urinate, and an increase in your body's metabolism.  You may notice the fetus "dropping," or moving lower in your abdomen (lightening).  You may have increased vaginal discharge.  You may notice your joints feel loose and you may have pain around your pelvic bone.  What to expect at prenatal visits You will have prenatal exams every 2 weeks until week 36. Then you will have weekly prenatal exams. During a routine prenatal visit:  You will be weighed to make sure you and the baby are growing normally.  Your blood pressure will be taken.  Your abdomen will be measured to track your baby's growth.  The fetal heartbeat will be listened to.  Any test results from the previous visit will be discussed.  You may have a cervical check near your due date to see if your cervix has softened or thinned (effaced).  You will be tested for Group B streptococcus. This happens between 35 and 37 weeks.  Your health care provider may ask you:  What your birth plan is.  How you are feeling.  If you are feeling the baby move.  If you have had   any abnormal symptoms, such as leaking fluid, bleeding, severe headaches, or abdominal cramping.  If you are using any tobacco products, including cigarettes, chewing tobacco, and electronic cigarettes.  If you have any questions.  Other tests or screenings that may be performed during your third trimester include:  Blood tests that check for low iron levels (anemia).  Fetal testing to check the health, activity level, and growth of the fetus. Testing is done if you have certain medical conditions or if there are problems during the  pregnancy.  Nonstress test (NST). This test checks the health of your baby to make sure there are no signs of problems, such as the baby not getting enough oxygen. During this test, a belt is placed around your belly. The baby is made to move, and its heart rate is monitored during movement.  What is false labor? False labor is a condition in which you feel small, irregular tightenings of the muscles in the womb (contractions) that usually go away with rest, changing position, or drinking water. These are called Braxton Hicks contractions. Contractions may last for hours, days, or even weeks before true labor sets in. If contractions come at regular intervals, become more frequent, increase in intensity, or become painful, you should see your health care provider. What are the signs of labor?  Abdominal cramps.  Regular contractions that start at 10 minutes apart and become stronger and more frequent with time.  Contractions that start on the top of the uterus and spread down to the lower abdomen and back.  Increased pelvic pressure and dull back pain.  A watery or bloody mucus discharge that comes from the vagina.  Leaking of amniotic fluid. This is also known as your "water breaking." It could be a slow trickle or a gush. Let your health care provider know if it has a color or strange odor. If you have any of these signs, call your health care provider right away, even if it is before your due date. Follow these instructions at home: Medicines  Follow your health care provider's instructions regarding medicine use. Specific medicines may be either safe or unsafe to take during pregnancy.  Take a prenatal vitamin that contains at least 600 micrograms (mcg) of folic acid.  If you develop constipation, try taking a stool softener if your health care provider approves. Eating and drinking  Eat a balanced diet that includes fresh fruits and vegetables, whole grains, good sources of protein  such as meat, eggs, or tofu, and low-fat dairy. Your health care provider will help you determine the amount of weight gain that is right for you.  Avoid raw meat and uncooked cheese. These carry germs that can cause birth defects in the baby.  If you have low calcium intake from food, talk to your health care provider about whether you should take a daily calcium supplement.  Eat four or five small meals rather than three large meals a day.  Limit foods that are high in fat and processed sugars, such as fried and sweet foods.  To prevent constipation: ? Drink enough fluid to keep your urine clear or pale yellow. ? Eat foods that are high in fiber, such as fresh fruits and vegetables, whole grains, and beans. Activity  Exercise only as directed by your health care provider. Most women can continue their usual exercise routine during pregnancy. Try to exercise for 30 minutes at least 5 days a week. Stop exercising if you experience uterine contractions.  Avoid heavy   lifting.  Do not exercise in extreme heat or humidity, or at high altitudes.  Wear low-heel, comfortable shoes.  Practice good posture.  You may continue to have sex unless your health care provider tells you otherwise. Relieving pain and discomfort  Take frequent breaks and rest with your legs elevated if you have leg cramps or low back pain.  Take warm sitz baths to soothe any pain or discomfort caused by hemorrhoids. Use hemorrhoid cream if your health care provider approves.  Wear a good support bra to prevent discomfort from breast tenderness.  If you develop varicose veins: ? Wear support pantyhose or compression stockings as told by your healthcare provider. ? Elevate your feet for 15 minutes, 3-4 times a day. Prenatal care  Write down your questions. Take them to your prenatal visits.  Keep all your prenatal visits as told by your health care provider. This is important. Safety  Wear your seat belt at  all times when driving.  Make a list of emergency phone numbers, including numbers for family, friends, the hospital, and police and fire departments. General instructions  Avoid cat litter boxes and soil used by cats. These carry germs that can cause birth defects in the baby. If you have a cat, ask someone to clean the litter box for you.  Do not travel far distances unless it is absolutely necessary and only with the approval of your health care provider.  Do not use hot tubs, steam rooms, or saunas.  Do not drink alcohol.  Do not use any products that contain nicotine or tobacco, such as cigarettes and e-cigarettes. If you need help quitting, ask your health care provider.  Do not use any medicinal herbs or unprescribed drugs. These chemicals affect the formation and growth of the baby.  Do not douche or use tampons or scented sanitary pads.  Do not cross your legs for long periods of time.  To prepare for the arrival of your baby: ? Take prenatal classes to understand, practice, and ask questions about labor and delivery. ? Make a trial run to the hospital. ? Visit the hospital and tour the maternity area. ? Arrange for maternity or paternity leave through employers. ? Arrange for family and friends to take care of pets while you are in the hospital. ? Purchase a rear-facing car seat and make sure you know how to install it in your car. ? Pack your hospital bag. ? Prepare the baby's nursery. Make sure to remove all pillows and stuffed animals from the baby's crib to prevent suffocation.  Visit your dentist if you have not gone during your pregnancy. Use a soft toothbrush to brush your teeth and be gentle when you floss. Contact a health care provider if:  You are unsure if you are in labor or if your water has broken.  You become dizzy.  You have mild pelvic cramps, pelvic pressure, or nagging pain in your abdominal area.  You have lower back pain.  You have persistent  nausea, vomiting, or diarrhea.  You have an unusual or bad smelling vaginal discharge.  You have pain when you urinate. Get help right away if:  Your water breaks before 37 weeks.  You have regular contractions less than 5 minutes apart before 37 weeks.  You have a fever.  You are leaking fluid from your vagina.  You have spotting or bleeding from your vagina.  You have severe abdominal pain or cramping.  You have rapid weight loss or weight gain.    You have shortness of breath with chest pain.  You notice sudden or extreme swelling of your face, hands, ankles, feet, or legs.  Your baby makes fewer than 10 movements in 2 hours.  You have severe headaches that do not go away when you take medicine.  You have vision changes. Summary  The third trimester is from week 28 through week 40, months 7 through 9. The third trimester is a time when the unborn baby (fetus) is growing rapidly.  During the third trimester, your discomfort may increase as you and your baby continue to gain weight. You may have abdominal, leg, and back pain, sleeping problems, and an increased need to urinate.  During the third trimester your breasts will keep growing and they will continue to become tender. A yellow fluid (colostrum) may leak from your breasts. This is the first milk you are producing for your baby.  False labor is a condition in which you feel small, irregular tightenings of the muscles in the womb (contractions) that eventually go away. These are called Braxton Hicks contractions. Contractions may last for hours, days, or even weeks before true labor sets in.  Signs of labor can include: abdominal cramps; regular contractions that start at 10 minutes apart and become stronger and more frequent with time; watery or bloody mucus discharge that comes from the vagina; increased pelvic pressure and dull back pain; and leaking of amniotic fluid. This information is not intended to replace advice  given to you by your health care provider. Make sure you discuss any questions you have with your health care provider. Document Released: 11/10/2001 Document Revised: 04/23/2016 Document Reviewed: 01/17/2013 [employer] Interactive Patient Education  2017 [employer]  

## 2017-09-15 ENCOUNTER — Other Ambulatory Visit: Payer: Managed Care, Other (non HMO)

## 2017-09-15 ENCOUNTER — Encounter: Payer: Self-pay | Admitting: Obstetrics

## 2017-09-15 DIAGNOSIS — Z3480 Encounter for supervision of other normal pregnancy, unspecified trimester: Secondary | ICD-10-CM

## 2017-09-16 ENCOUNTER — Telehealth: Payer: Self-pay

## 2017-09-16 ENCOUNTER — Other Ambulatory Visit: Payer: Self-pay | Admitting: Certified Nurse Midwife

## 2017-09-16 DIAGNOSIS — O99012 Anemia complicating pregnancy, second trimester: Secondary | ICD-10-CM

## 2017-09-16 DIAGNOSIS — Z3480 Encounter for supervision of other normal pregnancy, unspecified trimester: Secondary | ICD-10-CM

## 2017-09-16 LAB — RPR: RPR: NONREACTIVE

## 2017-09-16 LAB — GLUCOSE TOLERANCE, 2 HOURS W/ 1HR
GLUCOSE, 1 HOUR: 80 mg/dL (ref 65–179)
GLUCOSE, 2 HOUR: 67 mg/dL (ref 65–152)
Glucose, Fasting: 78 mg/dL (ref 65–91)

## 2017-09-16 LAB — CBC
Hematocrit: 31.9 % — ABNORMAL LOW (ref 34.0–46.6)
Hemoglobin: 10.4 g/dL — ABNORMAL LOW (ref 11.1–15.9)
MCH: 30 pg (ref 26.6–33.0)
MCHC: 32.6 g/dL (ref 31.5–35.7)
MCV: 92 fL (ref 79–97)
PLATELETS: 259 10*3/uL (ref 150–379)
RBC: 3.47 x10E6/uL — AB (ref 3.77–5.28)
RDW: 13.4 % (ref 12.3–15.4)
WBC: 10.4 10*3/uL (ref 3.4–10.8)

## 2017-09-16 LAB — HIV ANTIBODY (ROUTINE TESTING W REFLEX): HIV SCREEN 4TH GENERATION: NONREACTIVE

## 2017-09-16 MED ORDER — CITRANATAL BLOOM 90-1 MG PO TABS: 1.0000 | ORAL_TABLET | Freq: Every day | ORAL | 12 refills | Status: DC

## 2017-09-16 NOTE — Telephone Encounter (Signed)
Unable to leave message, subscribers phone is not accepting calls at this time.

## 2017-09-16 NOTE — Progress Notes (Signed)
Phone is not accepting calls at this time.

## 2017-09-16 NOTE — Telephone Encounter (Signed)
-----   Message from Morene Crocker, CNM sent at 09/16/2017 12:25 PM EDT ----- Please let her know that she is slightly anemic.  I have added bloom to her other PNV.  Thank you. R.Denney CNM

## 2017-09-27 ENCOUNTER — Encounter: Payer: Self-pay | Admitting: Obstetrics and Gynecology

## 2017-10-07 ENCOUNTER — Encounter: Payer: Self-pay | Admitting: Obstetrics and Gynecology

## 2017-10-07 ENCOUNTER — Ambulatory Visit (INDEPENDENT_AMBULATORY_CARE_PROVIDER_SITE_OTHER): Payer: Managed Care, Other (non HMO) | Admitting: Obstetrics and Gynecology

## 2017-10-07 VITALS — BP 121/78 | HR 91 | Wt 160.4 lb

## 2017-10-07 DIAGNOSIS — Z283 Underimmunization status: Secondary | ICD-10-CM

## 2017-10-07 DIAGNOSIS — Z3480 Encounter for supervision of other normal pregnancy, unspecified trimester: Secondary | ICD-10-CM

## 2017-10-07 DIAGNOSIS — O9989 Other specified diseases and conditions complicating pregnancy, childbirth and the puerperium: Secondary | ICD-10-CM

## 2017-10-07 DIAGNOSIS — O09899 Supervision of other high risk pregnancies, unspecified trimester: Secondary | ICD-10-CM

## 2017-10-07 DIAGNOSIS — Z2839 Other underimmunization status: Secondary | ICD-10-CM

## 2017-10-07 MED ORDER — COMFORT FIT MATERNITY SUPP MED MISC: 0 refills | Status: DC

## 2017-10-07 NOTE — Progress Notes (Signed)
Patient reports good fetal movement with some uterine irritability. Pt complains of a little bit of bleeding from hemmrhoids. Advised of labs and change in PNV.

## 2017-10-07 NOTE — Progress Notes (Signed)
   PRENATAL VISIT NOTE  Subjective:  Brandi Castillo is a 26 y.o. G3P1011 at [redacted]w[redacted]d being seen today for ongoing prenatal care.  She is currently monitored for the following issues for this low-risk pregnancy and has History of anxiety; Family history of breast cancer; Supervision of other normal pregnancy, antepartum; Low vitamin D level; Rubella non-immune status, antepartum; and Maternal varicella, non-immune on their problem list.  Patient reports backache.  Contractions: Irritability. Vag. Bleeding: None.  Movement: Present. Denies leaking of fluid.   The following portions of the patient's history were reviewed and updated as appropriate: allergies, current medications, past family history, past medical history, past social history, past surgical history and problem list. Problem list updated.  Objective:   Vitals:   10/07/17 1457  BP: 121/78  Pulse: 91  Weight: 160 lb 6.4 oz (72.8 kg)    Fetal Status: Fetal Heart Rate (bpm): 148   Movement: Present     General:  Alert, oriented and cooperative. Patient is in no acute distress.  Skin: Skin is warm and dry. No rash noted.   Cardiovascular: Normal heart rate noted  Respiratory: Normal respiratory effort, no problems with respiration noted  Abdomen: Soft, gravid, appropriate for gestational age.  Pain/Pressure: Present     Pelvic: Cervical exam deferred        Extremities: Normal range of motion.  Edema: Trace  Mental Status:  Normal mood and affect. Normal behavior. Normal judgment and thought content.   Assessment and Plan:  Pregnancy: G3P1011 at [redacted]w[redacted]d  1. Supervision of other normal pregnancy, antepartum Patient is doing well without complaints Rx maternity support belt provided to help with back discomfort Patient informed of anemia and to change PNV to bloom 2. Maternal varicella, non-immune Will offer postparum  3. Rubella non-immune status, antepartum Will offer postpartum  Preterm labor symptoms and general  obstetric precautions including but not limited to vaginal bleeding, contractions, leaking of fluid and fetal movement were reviewed in detail with the patient. Please refer to After Visit Summary for other counseling recommendations.  Return in about 2 weeks (around 10/21/2017) for ROB.   Mora Bellman, MD

## 2017-10-08 ENCOUNTER — Telehealth: Payer: Self-pay | Admitting: Pediatrics

## 2017-10-08 NOTE — Telephone Encounter (Signed)
Per pharmacy fax, Citranatal Bloom is not covered by ins ([employer]). Would you like to change rx or initiate PA?

## 2017-10-11 NOTE — Telephone Encounter (Signed)
Does she have secondary insurance? If not she will need to take OTC prenatal vitamins.  Thank you. Kellyann Ordway

## 2017-10-20 ENCOUNTER — Ambulatory Visit (INDEPENDENT_AMBULATORY_CARE_PROVIDER_SITE_OTHER): Payer: Managed Care, Other (non HMO) | Admitting: Nurse Practitioner

## 2017-10-20 VITALS — BP 117/76 | HR 99 | Wt 163.0 lb

## 2017-10-20 DIAGNOSIS — O09899 Supervision of other high risk pregnancies, unspecified trimester: Secondary | ICD-10-CM

## 2017-10-20 DIAGNOSIS — Z3480 Encounter for supervision of other normal pregnancy, unspecified trimester: Secondary | ICD-10-CM

## 2017-10-20 DIAGNOSIS — O26843 Uterine size-date discrepancy, third trimester: Secondary | ICD-10-CM

## 2017-10-20 DIAGNOSIS — R000 Tachycardia, unspecified: Secondary | ICD-10-CM

## 2017-10-20 DIAGNOSIS — Z3483 Encounter for supervision of other normal pregnancy, third trimester: Secondary | ICD-10-CM

## 2017-10-20 DIAGNOSIS — Z283 Underimmunization status: Secondary | ICD-10-CM

## 2017-10-20 DIAGNOSIS — O9989 Other specified diseases and conditions complicating pregnancy, childbirth and the puerperium: Secondary | ICD-10-CM

## 2017-10-20 DIAGNOSIS — Z2839 Other underimmunization status: Secondary | ICD-10-CM

## 2017-10-20 NOTE — Progress Notes (Signed)
Patient reports good fetal movement, and complains of body aches. Pt states that she had a cardiology referral but her phone was turned off. Provided pt with referral info and left message with referral coordinator to follow up.

## 2017-10-20 NOTE — Progress Notes (Signed)
    Subjective:  Brandi Castillo is a 26 y.o. G3P1011 at [redacted]w[redacted]d being seen today for ongoing prenatal care.  She is currently monitored for the following issues for this low-risk pregnancy and has History of anxiety; Family history of breast cancer; Supervision of other normal pregnancy, antepartum; Low vitamin D level; Rubella non-immune status, antepartum; and Maternal varicella, non-immune on their problem list.  Patient reports continues to have periodic near syncopal episodes - sporatic in occurence..  Contractions: Irritability.  .  Movement: Present. Denies leaking of fluid.   The following portions of the patient's history were reviewed and updated as appropriate: allergies, current medications, past family history, past medical history, past social history, past surgical history and problem list. Problem list updated.  Objective:   Vitals:   10/20/17 1318  BP: 117/76  Pulse: 99  Weight: 163 lb (73.9 kg)    Fetal Status: Fetal Heart Rate (bpm): 134 Fundal Height: 36 cm Movement: Present     General:  Alert, oriented and cooperative. Patient is in no acute distress.  Skin: Skin is warm and dry. No rash noted.   Cardiovascular: Normal heart rate noted  Respiratory: Normal respiratory effort, no problems with respiration noted  Abdomen: Soft, gravid, appropriate for gestational age. Pain/Pressure: Present     Pelvic:  Cervical exam deferred        Extremities: Normal range of motion.  Edema: Trace  Mental Status: Normal mood and affect. Normal behavior. Normal judgment and thought content.   Urinalysis:      Assessment and Plan:  Pregnancy: G3P1011 at [redacted]w[redacted]d Patient reports good fetal movement and complains of body aches. Pt states that she had referral to cardiologist but her phone has been off so has not been to see them.  1. Supervision of other normal pregnancy, antepartum Will have referral coordinator call her on Monday to try to get scheduled with the cardiologist  now that her phone is working.  Still has periodic episodes with no warning - is lying down and has not had LOC.   Has symptoms of being hot and losing part of her vision and hearing before the episode.  Has not been able to identify any cause even when considering last time she ate and whether she had protein vs high carbs at the meal.  Other diagnoses of note - tachycardia and near syncopal episodes, maternal varicella immune and rubella non immune  Size greater than dates -  Will schedule outpatient ultrasound for EFW and AFI   Preterm labor symptoms and general obstetric precautions including but not limited to vaginal bleeding, contractions, leaking of fluid and fetal movement were reviewed in detail with the patient. Please refer to After Visit Summary for other counseling recommendations.  Return in about 2 weeks (around 11/03/2017).  Earlie Server, RN, MSN, NP-BC Nurse Practitioner, [facility] [employer], [facility] 10/20/2017 2:14 PM

## 2017-10-20 NOTE — Patient Instructions (Addendum)
Expect the office to call you next week about the referral to the cardiologist. Return in 2 weeks for your next office visit. Will be scheduled for outpatient ultrasound as you are measuring larger than dates.

## 2017-10-27 ENCOUNTER — Ambulatory Visit (HOSPITAL_COMMUNITY)
Admission: RE | Admit: 2017-10-27 | Discharge: 2017-10-27 | Disposition: A | Discharge status: Home / Self Care | Payer: Managed Care, Other (non HMO) | Source: Ambulatory Visit | Attending: Nurse Practitioner | Admitting: Nurse Practitioner

## 2017-10-27 DIAGNOSIS — Z3A33 33 weeks gestation of pregnancy: Secondary | ICD-10-CM | POA: Insufficient documentation

## 2017-10-27 DIAGNOSIS — Z362 Encounter for other antenatal screening follow-up: Secondary | ICD-10-CM | POA: Diagnosis not present

## 2017-10-27 DIAGNOSIS — O26843 Uterine size-date discrepancy, third trimester: Secondary | ICD-10-CM | POA: Insufficient documentation

## 2017-10-28 ENCOUNTER — Encounter: Payer: Self-pay | Admitting: *Deleted

## 2017-11-03 ENCOUNTER — Ambulatory Visit (INDEPENDENT_AMBULATORY_CARE_PROVIDER_SITE_OTHER): Payer: Managed Care, Other (non HMO) | Admitting: Certified Nurse Midwife

## 2017-11-03 ENCOUNTER — Encounter: Payer: Self-pay | Admitting: Certified Nurse Midwife

## 2017-11-03 VITALS — BP 128/77 | HR 101 | Wt 163.8 lb

## 2017-11-03 DIAGNOSIS — R7989 Other specified abnormal findings of blood chemistry: Secondary | ICD-10-CM

## 2017-11-03 DIAGNOSIS — Z2839 Other underimmunization status: Secondary | ICD-10-CM

## 2017-11-03 DIAGNOSIS — O9989 Other specified diseases and conditions complicating pregnancy, childbirth and the puerperium: Secondary | ICD-10-CM

## 2017-11-03 DIAGNOSIS — Z3483 Encounter for supervision of other normal pregnancy, third trimester: Secondary | ICD-10-CM

## 2017-11-03 DIAGNOSIS — Z283 Underimmunization status: Secondary | ICD-10-CM

## 2017-11-03 DIAGNOSIS — Z3480 Encounter for supervision of other normal pregnancy, unspecified trimester: Secondary | ICD-10-CM

## 2017-11-03 NOTE — Progress Notes (Signed)
   PRENATAL VISIT NOTE  Subjective:  Brandi Castillo is a 26 y.o. G3P1011 at 42w3dbeing seen today for ongoing prenatal care.  She is currently monitored for the following issues for this low-risk pregnancy and has History of anxiety; Family history of breast cancer; Supervision of other normal pregnancy, antepartum; Low vitamin D level; Rubella non-immune status, antepartum; and Maternal varicella, non-immune on their problem list.  Patient reports no complaints.  Contractions: Not present. Vag. Bleeding: None.  Movement: Present. Denies leaking of fluid.   The following portions of the patient's history were reviewed and updated as appropriate: allergies, current medications, past family history, past medical history, past social history, past surgical history and problem list. Problem list updated.  Objective:   Vitals:   11/03/17 0812  BP: 128/77  Pulse: (!) 101  Weight: 163 lb 12.8 oz (74.3 kg)    Fetal Status: Fetal Heart Rate (bpm): 140; doppler Fundal Height: 35 cm Movement: Present     General:  Alert, oriented and cooperative. Patient is in no acute distress.  Skin: Skin is warm and dry. No rash noted.   Cardiovascular: Normal heart rate noted  Respiratory: Normal respiratory effort, no problems with respiration noted  Abdomen: Soft, gravid, appropriate for gestational age.  Pain/Pressure: Present     Pelvic: Cervical exam deferred        Extremities: Normal range of motion.  Edema: None  Mental Status:  Normal mood and affect. Normal behavior. Normal judgment and thought content.   Assessment and Plan:  Pregnancy: G3P1011 at 336w3d1. Supervision of other normal pregnancy, antepartum     Doing well  2. Rubella non-immune status, antepartum     MMR postpartum  3. Low vitamin D level     Taking vitamin D weekly  Preterm labor symptoms and general obstetric precautions including but not limited to vaginal bleeding, contractions, leaking of fluid and fetal  movement were reviewed in detail with the patient. Please refer to After Visit Summary for other counseling recommendations.  Return in about 1 week (around 11/10/2017) for ROB, GBS.   RaMorene CrockerCNM

## 2017-11-03 NOTE — Progress Notes (Signed)
Patient reports good fetal movement and states that she had some pressure this morning but it has stopped. Pt denies contractions and bleeding.

## 2017-11-10 ENCOUNTER — Ambulatory Visit (INDEPENDENT_AMBULATORY_CARE_PROVIDER_SITE_OTHER): Payer: Managed Care, Other (non HMO) | Admitting: Certified Nurse Midwife

## 2017-11-10 ENCOUNTER — Encounter: Payer: Self-pay | Admitting: *Deleted

## 2017-11-10 ENCOUNTER — Other Ambulatory Visit: Payer: Self-pay

## 2017-11-10 ENCOUNTER — Other Ambulatory Visit (HOSPITAL_COMMUNITY)
Admission: RE | Admit: 2017-11-10 | Discharge: 2017-11-10 | Disposition: A | Discharge status: Home / Self Care | Payer: Managed Care, Other (non HMO) | Source: Ambulatory Visit | Attending: Certified Nurse Midwife | Admitting: Certified Nurse Midwife

## 2017-11-10 VITALS — BP 126/76 | HR 94 | Wt 166.8 lb

## 2017-11-10 DIAGNOSIS — O9989 Other specified diseases and conditions complicating pregnancy, childbirth and the puerperium: Secondary | ICD-10-CM

## 2017-11-10 DIAGNOSIS — K219 Gastro-esophageal reflux disease without esophagitis: Secondary | ICD-10-CM

## 2017-11-10 DIAGNOSIS — Z283 Underimmunization status: Secondary | ICD-10-CM

## 2017-11-10 DIAGNOSIS — O09899 Supervision of other high risk pregnancies, unspecified trimester: Secondary | ICD-10-CM

## 2017-11-10 DIAGNOSIS — Z3480 Encounter for supervision of other normal pregnancy, unspecified trimester: Secondary | ICD-10-CM | POA: Diagnosis not present

## 2017-11-10 DIAGNOSIS — O09893 Supervision of other high risk pregnancies, third trimester: Secondary | ICD-10-CM

## 2017-11-10 DIAGNOSIS — Z2839 Other underimmunization status: Secondary | ICD-10-CM

## 2017-11-10 DIAGNOSIS — R7989 Other specified abnormal findings of blood chemistry: Secondary | ICD-10-CM

## 2017-11-10 DIAGNOSIS — O99613 Diseases of the digestive system complicating pregnancy, third trimester: Secondary | ICD-10-CM

## 2017-11-10 DIAGNOSIS — O99619 Diseases of the digestive system complicating pregnancy, unspecified trimester: Secondary | ICD-10-CM

## 2017-11-10 MED ORDER — OMEPRAZOLE 20 MG PO CPDR: 20.0000 mg | DELAYED_RELEASE_CAPSULE | Freq: Two times a day (BID) | ORAL | 5 refills | Status: DC

## 2017-11-10 NOTE — Progress Notes (Signed)
   PRENATAL VISIT NOTE  Subjective:  Brandi Castillo is a 26 y.o. G3P1011 at 76w3dbeing seen today for ongoing prenatal care.  She is currently monitored for the following issues for this low-risk pregnancy and has History of anxiety; Family history of breast cancer; Supervision of other normal pregnancy, antepartum; Low vitamin D level; Rubella non-immune status, antepartum; and Maternal varicella, non-immune on their problem list.  Patient reports backache, no bleeding, no leaking, occasional contractions and upper abdominal pain that is sharp shooting from lower abdomen, that comes and goes with changing of positions.  Contractions: Not present. Vag. Bleeding: None.  Movement: Present. Denies leaking of fluid.   The following portions of the patient's history were reviewed and updated as appropriate: allergies, current medications, past family history, past medical history, past social history, past surgical history and problem list. Problem list updated.  Objective:   Vitals:   11/10/17 1332  BP: 126/76  Pulse: 94  Weight: 166 lb 12.8 oz (75.7 kg)    Fetal Status: Fetal Heart Rate (bpm): 145; doppler Fundal Height: 35 cm Movement: Present  Presentation: Vertex  General:  Alert, oriented and cooperative. Patient is in no acute distress.  Skin: Skin is warm and dry. No rash noted.   Cardiovascular: Normal heart rate noted  Respiratory: Normal respiratory effort, no problems with respiration noted  Abdomen: Soft, gravid, appropriate for gestational age.  Pain/Pressure: Present     Pelvic: Cervical exam performed Dilation: 1 Effacement (%): Thick Station: -3  Extremities: Normal range of motion.  Edema: None  Mental Status:  Normal mood and affect. Normal behavior. Normal judgment and thought content.   Assessment and Plan:  Pregnancy: G3P1011 at 324w3d1. Supervision of other normal pregnancy, antepartum     Doing well.  Normal discomforts of pregnancy - Strep Gp B NAA -  Cervicovaginal ancillary only  2. Rubella non-immune status, antepartum     MMR postpartum  3. Maternal varicella, non-immune     Varicella postpartum  4. Low vitamin D level    Taking weekly vitamin D  5. Gastroesophageal reflux in pregnancy     OTC Tums - omeprazole (PRILOSEC) 20 MG capsule; Take 1 capsule (20 mg total) by mouth 2 (two) times daily before a meal.  Dispense: 60 capsule; Refill: 5  Preterm labor symptoms and general obstetric precautions including but not limited to vaginal bleeding, contractions, leaking of fluid and fetal movement were reviewed in detail with the patient. Please refer to After Visit Summary for other counseling recommendations.  Return in about 1 week (around 11/17/2017) for ROB.   RaMorene CrockerCNM

## 2017-11-10 NOTE — Progress Notes (Signed)
Complains of stomach pains at night 8/10 x 2 days

## 2017-11-11 LAB — CERVICOVAGINAL ANCILLARY ONLY
Chlamydia: NEGATIVE
NEISSERIA GONORRHEA: NEGATIVE

## 2017-11-12 LAB — OB RESULTS CONSOLE GBS: STREP GROUP B AG: NEGATIVE

## 2017-11-12 LAB — OB RESULTS CONSOLE GC/CHLAMYDIA
Chlamydia: NEGATIVE
Gonorrhea: NEGATIVE

## 2017-11-12 LAB — STREP GP B NAA: Strep Gp B NAA: NEGATIVE

## 2017-11-18 ENCOUNTER — Ambulatory Visit (INDEPENDENT_AMBULATORY_CARE_PROVIDER_SITE_OTHER): Payer: Managed Care, Other (non HMO) | Admitting: Certified Nurse Midwife

## 2017-11-18 ENCOUNTER — Other Ambulatory Visit: Payer: Self-pay | Admitting: Certified Nurse Midwife

## 2017-11-18 ENCOUNTER — Encounter: Payer: Self-pay | Admitting: Cardiovascular Disease

## 2017-11-18 ENCOUNTER — Ambulatory Visit (INDEPENDENT_AMBULATORY_CARE_PROVIDER_SITE_OTHER): Payer: Managed Care, Other (non HMO) | Admitting: Cardiovascular Disease

## 2017-11-18 ENCOUNTER — Encounter: Payer: Self-pay | Admitting: Certified Nurse Midwife

## 2017-11-18 VITALS — BP 102/64 | HR 71 | Ht 60.0 in | Wt 167.8 lb

## 2017-11-18 VITALS — BP 124/73 | HR 88 | Wt 167.6 lb

## 2017-11-18 DIAGNOSIS — Z3480 Encounter for supervision of other normal pregnancy, unspecified trimester: Secondary | ICD-10-CM

## 2017-11-18 DIAGNOSIS — R7989 Other specified abnormal findings of blood chemistry: Secondary | ICD-10-CM

## 2017-11-18 DIAGNOSIS — I493 Ventricular premature depolarization: Secondary | ICD-10-CM | POA: Diagnosis not present

## 2017-11-18 DIAGNOSIS — O09899 Supervision of other high risk pregnancies, unspecified trimester: Secondary | ICD-10-CM

## 2017-11-18 DIAGNOSIS — R000 Tachycardia, unspecified: Secondary | ICD-10-CM | POA: Diagnosis not present

## 2017-11-18 DIAGNOSIS — O9989 Other specified diseases and conditions complicating pregnancy, childbirth and the puerperium: Secondary | ICD-10-CM

## 2017-11-18 DIAGNOSIS — Z2839 Other underimmunization status: Secondary | ICD-10-CM

## 2017-11-18 DIAGNOSIS — Z283 Underimmunization status: Secondary | ICD-10-CM

## 2017-11-18 NOTE — Patient Instructions (Signed)
Medication Instructions:  Your physician recommends that you continue on your current medications as directed. Please refer to the Current Medication list given to you today.  Labwork: NONE  Testing/Procedures: NONE  Follow-Up: Your physician wants you to follow-up in: 3 months with Dr. Acie Fredrickson.   If you need a refill on your cardiac medications before your next appointment, please call your pharmacy.

## 2017-11-18 NOTE — Progress Notes (Signed)
Patient reports good fetal movement, denies contractions.

## 2017-11-18 NOTE — Progress Notes (Signed)
Cardiology Office Note:    Date:  11/18/2017   ID:  Brandi Castillo, DOB Mar 31, 1991, MRN [id number]  PCP:  Patient, No Pcp Per  Cardiologist:  Mertie Moores, MD    Referring MD: Chancy Milroy, MD   Problem list 1.  Episodes of tachycardia 2.  Anxiety/depression 3.  Pregnancy  Chief Complaint  Patient presents with  . Tachycardia    History of Present Illness:    Brandi Castillo is a 26 y.o. female with a hx of episodes of CP and chest tightness.   Has spontaneous episodes of chest tightnss with weakness.   Gets very hot. Does not think that her heart is going fast. Thinks she has skipped heart beats on occasion   Will last perhaps a few minutes.  Improves if she lies down .  Cool water will help  She is currently pregnant.   ( second pregnancy) no issues with her first pregnancy  Works at a [place] at [employer].   Past Medical History:  Diagnosis Date  . Anxiety   . Bacterial infection   . H/O varicella     History reviewed. No pertinent surgical history.  Current Medications: Current Meds  Medication Sig  . Elastic Bandages & Supports (COMFORT FIT MATERNITY SUPP MED) MISC Wear daily when ambulating  . Elastic Bandages & Supports (MEDICAL COMPRESSION SOCKS) MISC 1 Units by Does not apply route daily.  . ferrous sulfate 325 (65 FE) MG tablet Take 325 mg by mouth daily with breakfast.  . omeprazole (PRILOSEC) 20 MG capsule Take 1 capsule (20 mg total) by mouth 2 (two) times daily before a meal.  . Prenatal Vit-Fe Fumarate-FA (PRENATAL MULTIVITAMIN) TABS Take 1 tablet by mouth daily.    . Prenatal-DSS-FeCb-FeGl-FA (CITRANATAL BLOOM) 90-1 MG TABS Take 1 tablet by mouth daily.  . Vitamin D, Ergocalciferol, (DRISDOL) 50000 units CAPS capsule Take 1 capsule (50,000 Units total) by mouth every 7 (seven) days.     Allergies:   Sulfa drugs cross reactors   Social History   Socioeconomic History  . Marital status: Single    Spouse name: None  .  Number of children: None  . Years of education: None  . Highest education level: None  Social Needs  . Financial resource strain: None  . Food insecurity - worry: None  . Food insecurity - inability: None  . Transportation needs - medical: None  . Transportation needs - non-medical: None  Occupational History  . None  Tobacco Use  . Smoking status: Never Smoker  . Smokeless tobacco: Never Used  Substance and Sexual Activity  . Alcohol use: No  . Drug use: No  . Sexual activity: None  Other Topics Concern  . None  Social History Narrative  . None     Family History: The patient's family history includes Alcohol abuse in her father, maternal grandfather, maternal grandmother, mother, paternal grandfather, and paternal grandmother; Asthma in her brother, mother, and sister; Depression in her mother; Diabetes in her maternal grandmother and paternal grandfather; Hypertension in her maternal grandfather. ROS:   Please see the history of present illness.     All other systems reviewed and are negative.  EKGs/Labs/Other Studies Reviewed:    The following studies were reviewed today:   EKG:  EKG is  ordered today.   Dec. 20, 2018: NSR at 71.   Recent Labs: 09/15/2017: Hemoglobin 10.4; Platelets 259  Recent Lipid Panel No results found for: CHOL, TRIG, HDL, CHOLHDL, VLDL, LDLCALC, LDLDIRECT  Physical Exam:    VS:  BP 102/64   Pulse 71   Ht 5' (1.524 m)   Wt 167 lb 12.8 oz (76.1 kg)   LMP 03/07/2017   SpO2 98%   BMI 32.77 kg/m     Wt Readings from Last 3 Encounters:  11/18/17 167 lb 12.8 oz (76.1 kg)  11/10/17 166 lb 12.8 oz (75.7 kg)  11/03/17 163 lb 12.8 oz (74.3 kg)     GEN:  Well nourished, well developed in no acute distress HEENT: Normal NECK: No JVD; No carotid bruits LYMPHATICS: No lymphadenopathy CARDIAC: RRR, no murmurs, rubs, gallops RESPIRATORY:  Clear to auscultation without rales, wheezing or rhonchi  ABDOMEN: Soft, non-tender,  [redacted] weeks  pregnant MUSCULOSKELETAL:  No edema; No deformity  SKIN: Warm and dry NEUROLOGIC:  Alert and oriented x 3 PSYCHIATRIC:  Normal affect   ASSESSMENT:    No diagnosis found. PLAN:    In order of problems listed above:  1. 1.  Palpitations:Kamaryn presents with palpitations.  Some of her episodes sound possibly consistent with supraventricular tachycardia.  Discussed the Valsalva maneuver and drink cold water.  I advised her to get an EKG while she is at work she has 1 of these while working.  She also has some symptoms that are consistent with premature ventricular contractions .  I told her that these are completely benign.  I recommended that she drinks V8 juice each morning to help with hydration and electrolyte replacement.  I will see her in 3 months for follow-up visit.   Medication Adjustments/Labs and Tests Ordered: Current medicines are reviewed at length with the patient today.  Concerns regarding medicines are outlined above.  No orders of the defined types were placed in this encounter.  No orders of the defined types were placed in this encounter.   Signed, Mertie Moores, MD  11/18/2017 10:40 AM    [place]

## 2017-11-18 NOTE — Progress Notes (Signed)
   PRENATAL VISIT NOTE  Subjective:  Brandi Castillo is a 26 y.o. G3P1011 at 61w4dbeing seen today for ongoing prenatal care.  She is currently monitored for the following issues for this low-risk pregnancy and has History of anxiety; Family history of breast cancer; Supervision of other normal pregnancy, antepartum; Low vitamin D level; Rubella non-immune status, antepartum; and Maternal varicella, non-immune on their problem list.  Patient reports no complaints.  Contractions: Not present. Vag. Bleeding: None.  Movement: Present. Denies leaking of fluid.   The following portions of the patient's history were reviewed and updated as appropriate: allergies, current medications, past family history, past medical history, past social history, past surgical history and problem list. Problem list updated.  Objective:   Vitals:   11/18/17 1357  BP: 124/73  Pulse: 88  Weight: 167 lb 9.6 oz (76 kg)    Fetal Status: Fetal Heart Rate (bpm): 136; doppler Fundal Height: 36 cm Movement: Present     General:  Alert, oriented and cooperative. Patient is in no acute distress.  Skin: Skin is warm and dry. No rash noted.   Cardiovascular: Normal heart rate noted  Respiratory: Normal respiratory effort, no problems with respiration noted  Abdomen: Soft, gravid, appropriate for gestational age.  Pain/Pressure: Present     Pelvic: Cervical exam deferred        Extremities: Normal range of motion.  Edema: None  Mental Status:  Normal mood and affect. Normal behavior. Normal judgment and thought content.   Assessment and Plan:  Pregnancy: G3P1011 at 369w4d1. Supervision of other normal pregnancy, antepartum     Doing well  2. Rubella non-immune status, antepartum     MMR postpartum  3. Maternal varicella, non-immune     Varicella postpartum at HD  4. Low vitamin D level     Taking weekly vitamin D  Preterm labor symptoms and general obstetric precautions including but not limited to  vaginal bleeding, contractions, leaking of fluid and fetal movement were reviewed in detail with the patient. Please refer to After Visit Summary for other counseling recommendations.  Return in about 1 week (around 11/25/2017) for ROB.   RaMorene CrockerCNM.

## 2017-11-25 ENCOUNTER — Encounter: Payer: Self-pay | Admitting: Obstetrics & Gynecology

## 2017-11-26 ENCOUNTER — Telehealth: Payer: Self-pay | Admitting: *Deleted

## 2017-11-26 ENCOUNTER — Encounter: Payer: Self-pay | Admitting: Obstetrics and Gynecology

## 2017-11-26 ENCOUNTER — Ambulatory Visit (INDEPENDENT_AMBULATORY_CARE_PROVIDER_SITE_OTHER): Payer: Managed Care, Other (non HMO) | Admitting: Obstetrics and Gynecology

## 2017-11-26 ENCOUNTER — Other Ambulatory Visit: Payer: Self-pay

## 2017-11-26 ENCOUNTER — Encounter: Payer: Self-pay | Admitting: *Deleted

## 2017-11-26 VITALS — BP 120/74 | HR 79 | Wt 170.0 lb

## 2017-11-26 DIAGNOSIS — Z3480 Encounter for supervision of other normal pregnancy, unspecified trimester: Secondary | ICD-10-CM

## 2017-11-26 DIAGNOSIS — Z2839 Other underimmunization status: Secondary | ICD-10-CM

## 2017-11-26 DIAGNOSIS — O9989 Other specified diseases and conditions complicating pregnancy, childbirth and the puerperium: Secondary | ICD-10-CM

## 2017-11-26 DIAGNOSIS — Z283 Underimmunization status: Secondary | ICD-10-CM

## 2017-11-26 NOTE — Progress Notes (Signed)
   PRENATAL VISIT NOTE  Subjective:  Brandi Castillo is a 26 y.o. G3P1011 at [redacted]w[redacted]d being seen today for ongoing prenatal care.  She is currently monitored for the following issues for this low-risk pregnancy and has History of anxiety; Family history of breast cancer; Supervision of other normal pregnancy, antepartum; Low vitamin D level; Rubella non-immune status, antepartum; and Maternal varicella, non-immune on their problem list.  Patient reports no complaints.  Contractions: Not present. Vag. Bleeding: None.  Movement: Present. Denies leaking of fluid.   The following portions of the patient's history were reviewed and updated as appropriate: allergies, current medications, past family history, past medical history, past social history, past surgical history and problem list. Problem list updated.  Objective:   Vitals:   11/26/17 0838  BP: 120/74  Pulse: 79  Weight: 170 lb (77.1 kg)    Fetal Status: Fetal Heart Rate (bpm): 137 Fundal Height: 37 cm Movement: Present     General:  Alert, oriented and cooperative. Patient is in no acute distress.  Skin: Skin is warm and dry. No rash noted.   Cardiovascular: Normal heart rate noted  Respiratory: Normal respiratory effort, no problems with respiration noted  Abdomen: Soft, gravid, appropriate for gestational age.  Pain/Pressure: Present     Pelvic: Cervical exam deferred        Extremities: Normal range of motion.  Edema: None  Mental Status:  Normal mood and affect. Normal behavior. Normal judgment and thought content.   Assessment and Plan:  Pregnancy: G3P1011 at [redacted]w[redacted]d  1. Supervision of other normal pregnancy, antepartum Patient is doing well without complaints Precautions reviewed Informed of culture results  2. Rubella non-immune status, antepartum Will offer pp  Term labor symptoms and general obstetric precautions including but not limited to vaginal bleeding, contractions, leaking of fluid and fetal movement were  reviewed in detail with the patient. Please refer to After Visit Summary for other counseling recommendations.  Return in about 1 week (around 12/03/2017) for [place].   Mora Bellman, MD

## 2017-11-26 NOTE — Progress Notes (Signed)
Reports no problems today.

## 2017-11-26 NOTE — Telephone Encounter (Signed)
error 

## 2017-11-30 NOTE — L&D Delivery Note (Signed)
Patient is 27 y.o. [id number] [redacted]w[redacted]d admitted for SROM @100 . S/p IOL with foley bulb, cytotec, followed by Pitocin.  Prenatal course also complicated by anxiety. .  Delivery Note At 10:52 PM a viable female was delivered via Vaginal, Spontaneous (Presentation: LOA ).  APGAR: 7,9; weight  pending.   Placenta status: intact , spontaneous. Delay in delivering part of membranes but membranes completely delivered and no retained placenta found .  Cord:3 vessel Cord pH: ordered, pending   Anesthesia:  epidural Episiotomy:  None  Lacerations:  None  Est. Blood Loss (mL): 400 cc  Head delivered LOA. Nuchal cord present, easily reduced. Shoulder and body delivered in usual fashion. Cord clamped x2 immediately and baby sent to warmer. Infant with spontaneous cry. Cord blood drawn as well as cord arterial pH. Placenta delivered spontaneously with gentle cord traction. Fundus firm with massage and Pitocin.  Perineum inspected and found to have no laceration.  Mom to postpartum.  Baby to Couplet care / Skin to Skin.  Brandi Castillo 12/10/2017, 11:20 PM

## 2017-12-01 ENCOUNTER — Encounter: Payer: Self-pay | Admitting: *Deleted

## 2017-12-01 ENCOUNTER — Encounter: Payer: Self-pay | Admitting: Nurse Practitioner

## 2017-12-01 ENCOUNTER — Ambulatory Visit (INDEPENDENT_AMBULATORY_CARE_PROVIDER_SITE_OTHER): Payer: Managed Care, Other (non HMO) | Admitting: Nurse Practitioner

## 2017-12-01 VITALS — BP 121/73 | HR 82 | Wt 169.6 lb

## 2017-12-01 DIAGNOSIS — Z3483 Encounter for supervision of other normal pregnancy, third trimester: Secondary | ICD-10-CM

## 2017-12-01 DIAGNOSIS — Z3480 Encounter for supervision of other normal pregnancy, unspecified trimester: Secondary | ICD-10-CM

## 2017-12-01 NOTE — Progress Notes (Signed)
    Subjective:  Brandi Castillo is a 27 y.o. G3P1011 at [redacted]w[redacted]d being seen today for ongoing prenatal care.  She is currently monitored for the following issues for this low-risk pregnancy and has History of anxiety; Family history of breast cancer; Supervision of other normal pregnancy, antepartum; Low vitamin D level; Rubella non-immune status, antepartum; and Maternal varicella, non-immune on their problem list.  Patient reports feeling faint and had to lie down at work..  Contractions: Not present. Vag. Bleeding: None.  Movement: Present. Denies leaking of fluid.   The following portions of the patient's history were reviewed and updated as appropriate: allergies, current medications, past family history, past medical history, past social history, past surgical history and problem list. Problem list updated.  Objective:   Vitals:   12/01/17 1424  BP: 121/73  Pulse: 82  Weight: 169 lb 9.6 oz (76.9 kg)    Fetal Status: Fetal Heart Rate (bpm): 160 Fundal Height: 39 cm Movement: Present     General:  Alert, oriented and cooperative. Patient is in no acute distress.  Skin: Skin is warm and dry. No rash noted.   Cardiovascular: Normal heart rate noted  Respiratory: Normal respiratory effort, no problems with respiration noted  Abdomen: Soft, gravid, appropriate for gestational age. Pain/Pressure: Present     Pelvic:  Cervical exam deferred        Extremities: Normal range of motion.  Edema: Trace  Mental Status: Normal mood and affect. Normal behavior. Normal judgment and thought content.     Assessment and Plan:  Pregnancy: G3P1011 at [redacted]w[redacted]d  1. Supervision of other normal pregnancy, antepartum Only had crackers to eat today.  Advised eating protein to not feel faint.  Client was unaware of the dietary consequences of high carbs with no protein.  Will make an effort to include more protein - cheese, yogurt, nuts, meats - in her diet.  Term labor symptoms and general obstetric  precautions including but not limited to vaginal bleeding, contractions, leaking of fluid and fetal movement were reviewed in detail with the patient. Please refer to After Visit Summary for other counseling recommendations.  Return in about 1 week (around 12/08/2017).  Earlie Server, RN, MSN, NP-BC Nurse Practitioner, [facility] for [employer] [employer] 12/01/2017 3:07 PM

## 2017-12-02 ENCOUNTER — Encounter: Payer: Self-pay | Admitting: Obstetrics

## 2017-12-02 ENCOUNTER — Ambulatory Visit (INDEPENDENT_AMBULATORY_CARE_PROVIDER_SITE_OTHER): Payer: Medicaid Other | Admitting: Obstetrics

## 2017-12-02 ENCOUNTER — Encounter: Payer: Self-pay | Admitting: *Deleted

## 2017-12-02 VITALS — BP 124/70 | HR 80 | Temp 99.0°F | Wt 171.2 lb

## 2017-12-02 DIAGNOSIS — Z3483 Encounter for supervision of other normal pregnancy, third trimester: Secondary | ICD-10-CM

## 2017-12-02 DIAGNOSIS — R6889 Other general symptoms and signs: Secondary | ICD-10-CM

## 2017-12-02 MED ORDER — OSELTAMIVIR PHOSPHATE 75 MG PO CAPS: 75.0000 mg | ORAL_CAPSULE | Freq: Two times a day (BID) | ORAL | 0 refills | Status: DC

## 2017-12-02 NOTE — Addendum Note (Signed)
Addended by: Tristan Schroeder D on: 12/02/2017 03:30 PM   Modules accepted: Orders

## 2017-12-02 NOTE — Progress Notes (Signed)
Post nasal drip, chills. No body aches.

## 2017-12-02 NOTE — Progress Notes (Signed)
Subjective:  Brandi Castillo is a 27 y.o. G3P1011 at [redacted]w[redacted]d being seen today for ongoing prenatal care.  She is currently monitored for the following issues for this low-risk pregnancy and has History of anxiety; Family history of breast cancer; Supervision of other normal pregnancy, antepartum; Low vitamin D level; Rubella non-immune status, antepartum; and Maternal varicella, non-immune on their problem list.  Patient reports flu-like symptoms of postnasal drip and chills.  Positive Influenza B  Swab at her job site.  Contractions: Not present. Vag. Bleeding: None.  Movement: Present. Denies leaking of fluid.   The following portions of the patient's history were reviewed and updated as appropriate: allergies, current medications, past family history, past medical history, past social history, past surgical history and problem list. Problem list updated.  Objective:   Vitals:   12/02/17 1500  BP: 124/70  Pulse: 80  Temp: 99 F (37.2 C)  Weight: 171 lb 3.2 oz (77.7 kg)    Fetal Status:     Movement: Present     General:  Alert, oriented and cooperative. Patient is in no acute distress.  Skin: Skin is warm and dry. No rash noted.   Cardiovascular: Normal heart rate noted  Respiratory: Normal respiratory effort, no problems with respiration noted  Abdomen: Soft, gravid, appropriate for gestational age. Pain/Pressure: Present     Pelvic:  Cervical exam deferred        Extremities: Normal range of motion.  Edema: Trace  Mental Status: Normal mood and affect. Normal behavior. Normal judgment and thought content.   Urinalysis:      Assessment and Plan:  Pregnancy: G3P1011 at [redacted]w[redacted]d  1. Flu-like symptoms Rx: - oseltamivir (TAMIFLU) 75 MG capsule; Take 1 capsule (75 mg total) by mouth 2 (two) times daily.  Dispense: 10 capsule; Refill: 0  Term labor symptoms and general obstetric precautions including but not limited to vaginal bleeding, contractions, leaking of fluid and fetal  movement were reviewed in detail with the patient. Please refer to After Visit Summary for other counseling recommendations.  Return in about 1 week (around 12/09/2017) for ROB.   Shelly Bombard, MD

## 2017-12-04 LAB — INFLUENZA A AND B
Influenza A Ag, EIA: NEGATIVE
Influenza B Ag, EIA: NEGATIVE

## 2017-12-04 LAB — PLEASE NOTE:

## 2017-12-07 ENCOUNTER — Encounter: Payer: Self-pay | Admitting: Certified Nurse Midwife

## 2017-12-07 ENCOUNTER — Ambulatory Visit (INDEPENDENT_AMBULATORY_CARE_PROVIDER_SITE_OTHER): Payer: Managed Care, Other (non HMO) | Admitting: Certified Nurse Midwife

## 2017-12-07 ENCOUNTER — Telehealth (HOSPITAL_COMMUNITY): Payer: Self-pay | Admitting: *Deleted

## 2017-12-07 VITALS — BP 136/86 | HR 65 | Wt 171.4 lb

## 2017-12-07 DIAGNOSIS — O9989 Other specified diseases and conditions complicating pregnancy, childbirth and the puerperium: Secondary | ICD-10-CM

## 2017-12-07 DIAGNOSIS — Z283 Underimmunization status: Secondary | ICD-10-CM

## 2017-12-07 DIAGNOSIS — O09899 Supervision of other high risk pregnancies, unspecified trimester: Secondary | ICD-10-CM

## 2017-12-07 DIAGNOSIS — Z3480 Encounter for supervision of other normal pregnancy, unspecified trimester: Secondary | ICD-10-CM

## 2017-12-07 DIAGNOSIS — Z2839 Other underimmunization status: Secondary | ICD-10-CM

## 2017-12-07 DIAGNOSIS — O09893 Supervision of other high risk pregnancies, third trimester: Secondary | ICD-10-CM

## 2017-12-07 DIAGNOSIS — R7989 Other specified abnormal findings of blood chemistry: Secondary | ICD-10-CM

## 2017-12-07 NOTE — Progress Notes (Signed)
   PRENATAL VISIT NOTE  Subjective:  Brandi Castillo is a 27 y.o. G3P1011 at 90w2dbeing seen today for ongoing prenatal care.  She is currently monitored for the following issues for this low-risk pregnancy and has History of anxiety; Family history of breast cancer; Supervision of other normal pregnancy, antepartum; Low vitamin D level; Rubella non-immune status, antepartum; and Maternal varicella, non-immune on their problem list.  Patient reports no complaints.  Contractions: Not present. Vag. Bleeding: None.  Movement: Present. Denies leaking of fluid.   The following portions of the patient's history were reviewed and updated as appropriate: allergies, current medications, past family history, past medical history, past social history, past surgical history and problem list. Problem list updated.  Objective:   Vitals:   12/07/17 0811  BP: 136/86  Pulse: 65  Weight: 171 lb 6.4 oz (77.7 kg)    Fetal Status: Fetal Heart Rate (bpm): 132; doppler Fundal Height: 39 cm Movement: Present  Presentation: Vertex  General:  Alert, oriented and cooperative. Patient is in no acute distress.  Skin: Skin is warm and dry. No rash noted.   Cardiovascular: Normal heart rate noted  Respiratory: Normal respiratory effort, no problems with respiration noted  Abdomen: Soft, gravid, appropriate for gestational age.  Pain/Pressure: Present     Pelvic: Cervical exam performed Dilation: 1 Effacement (%): Thick Station: -3  Extremities: Normal range of motion.  Edema: None  Mental Status:  Normal mood and affect. Normal behavior. Normal judgment and thought content.   Assessment and Plan:  Pregnancy: G3P1011 at 3101w2d1. Supervision of other normal pregnancy, antepartum     Doing well.  IOL scheduled for 41 weeks per protocol.  Orders placed for admission/IOL.   2. Rubella non-immune status, antepartum     MMR postpartum  3. Maternal varicella, non-immune     Varicella postpartum  4. Low  vitamin D level     Taking weekly vitamin D.  Term labor symptoms and general obstetric precautions including but not limited to vaginal bleeding, contractions, leaking of fluid and fetal movement were reviewed in detail with the patient. Please refer to After Visit Summary for other counseling recommendations.  Return in about 1 week (around 12/14/2017) for ROB, NST.   RaMorene CrockerCNM

## 2017-12-07 NOTE — Progress Notes (Signed)
Patient reports good fetal movement with some pressure, denies contractions.

## 2017-12-07 NOTE — Telephone Encounter (Signed)
Preadmission screen  

## 2017-12-10 ENCOUNTER — Inpatient Hospital Stay (HOSPITAL_COMMUNITY): Payer: Medicaid Other | Admitting: Anesthesiology

## 2017-12-10 ENCOUNTER — Encounter (HOSPITAL_COMMUNITY): Payer: Self-pay

## 2017-12-10 ENCOUNTER — Inpatient Hospital Stay (HOSPITAL_COMMUNITY)
Admission: AD | Admit: 2017-12-10 | Discharge: 2017-12-12 | DRG: 807 | Disposition: A | Discharge status: Home / Self Care | Payer: Medicaid Other | Source: Ambulatory Visit | Attending: Obstetrics & Gynecology | Admitting: Obstetrics & Gynecology

## 2017-12-10 DIAGNOSIS — Z3A39 39 weeks gestation of pregnancy: Secondary | ICD-10-CM | POA: Diagnosis not present

## 2017-12-10 DIAGNOSIS — O99344 Other mental disorders complicating childbirth: Secondary | ICD-10-CM | POA: Diagnosis present

## 2017-12-10 DIAGNOSIS — O479 False labor, unspecified: Secondary | ICD-10-CM | POA: Diagnosis present

## 2017-12-10 DIAGNOSIS — O4292 Full-term premature rupture of membranes, unspecified as to length of time between rupture and onset of labor: Principal | ICD-10-CM | POA: Diagnosis present

## 2017-12-10 DIAGNOSIS — Z3480 Encounter for supervision of other normal pregnancy, unspecified trimester: Secondary | ICD-10-CM

## 2017-12-10 DIAGNOSIS — F419 Anxiety disorder, unspecified: Secondary | ICD-10-CM | POA: Diagnosis present

## 2017-12-10 LAB — CBC
HCT: 31.9 % — ABNORMAL LOW (ref 36.0–46.0)
Hemoglobin: 10.5 g/dL — ABNORMAL LOW (ref 12.0–15.0)
MCH: 27.9 pg (ref 26.0–34.0)
MCHC: 32.9 g/dL (ref 30.0–36.0)
MCV: 84.8 fL (ref 78.0–100.0)
PLATELETS: 187 10*3/uL (ref 150–400)
RBC: 3.76 MIL/uL — AB (ref 3.87–5.11)
RDW: 14.1 % (ref 11.5–15.5)
WBC: 10.9 10*3/uL — AB (ref 4.0–10.5)

## 2017-12-10 LAB — TYPE AND SCREEN
ABO/RH(D): A POS
Antibody Screen: NEGATIVE

## 2017-12-10 LAB — ABO/RH: ABO/RH(D): A POS

## 2017-12-10 LAB — POCT FERN TEST: POCT FERN TEST: POSITIVE

## 2017-12-10 LAB — RPR: RPR Ser Ql: NONREACTIVE

## 2017-12-10 MED ORDER — LACTATED RINGERS IV SOLN: 500.0000 mL | INTRAVENOUS | Status: DC | PRN

## 2017-12-10 MED ORDER — PHENYLEPHRINE 40 MCG/ML (10ML) SYRINGE FOR IV PUSH (FOR BLOOD PRESSURE SUPPORT)
80.0000 ug | PREFILLED_SYRINGE | INTRAVENOUS | Status: DC | PRN
  Filled 2017-12-10: qty 10
  Filled 2017-12-10: qty 5

## 2017-12-10 MED ORDER — OXYCODONE-ACETAMINOPHEN 5-325 MG PO TABS: 2.0000 | ORAL_TABLET | ORAL | Status: DC | PRN

## 2017-12-10 MED ORDER — SOD CITRATE-CITRIC ACID 500-334 MG/5ML PO SOLN: 30.0000 mL | ORAL | Status: DC | PRN

## 2017-12-10 MED ORDER — OXYCODONE-ACETAMINOPHEN 5-325 MG PO TABS: 1.0000 | ORAL_TABLET | ORAL | Status: DC | PRN

## 2017-12-10 MED ORDER — OXYTOCIN 40 UNITS IN LACTATED RINGERS INFUSION - SIMPLE MED
1.0000 m[IU]/min | INTRAVENOUS | Status: DC
  Administered 2017-12-10: 2 m[IU]/min via INTRAVENOUS
  Filled 2017-12-10: qty 1000

## 2017-12-10 MED ORDER — OXYTOCIN 40 UNITS IN LACTATED RINGERS INFUSION - SIMPLE MED
2.5000 [IU]/h | INTRAVENOUS | Status: DC
  Administered 2017-12-10: 2.5 [IU]/h via INTRAVENOUS

## 2017-12-10 MED ORDER — OXYTOCIN BOLUS FROM INFUSION
500.0000 mL | Freq: Once | INTRAVENOUS | Status: AC
  Administered 2017-12-10: 500 mL via INTRAVENOUS

## 2017-12-10 MED ORDER — ACETAMINOPHEN 325 MG PO TABS: 650.0000 mg | ORAL_TABLET | ORAL | Status: DC | PRN

## 2017-12-10 MED ORDER — EPHEDRINE 5 MG/ML INJ
10.0000 mg | INTRAVENOUS | Status: DC | PRN
  Filled 2017-12-10: qty 2

## 2017-12-10 MED ORDER — MISOPROSTOL 50MCG HALF TABLET
50.0000 ug | ORAL_TABLET | ORAL | Status: DC | PRN
Start: 2017-12-10 — End: 2017-12-11
  Administered 2017-12-10 (×2): 50 ug via BUCCAL
  Filled 2017-12-10 (×3): qty 1

## 2017-12-10 MED ORDER — FENTANYL 2.5 MCG/ML BUPIVACAINE 1/10 % EPIDURAL INFUSION (WH - ANES)
14.0000 mL/h | INTRAMUSCULAR | Status: DC | PRN
  Administered 2017-12-10: 10 mL/h via EPIDURAL
  Filled 2017-12-10: qty 100

## 2017-12-10 MED ORDER — PHENYLEPHRINE 40 MCG/ML (10ML) SYRINGE FOR IV PUSH (FOR BLOOD PRESSURE SUPPORT)
80.0000 ug | PREFILLED_SYRINGE | INTRAVENOUS | Status: DC | PRN
  Administered 2017-12-10: 80 ug via INTRAVENOUS
  Filled 2017-12-10: qty 5

## 2017-12-10 MED ORDER — DIPHENHYDRAMINE HCL 50 MG/ML IJ SOLN: 12.5000 mg | INTRAMUSCULAR | Status: DC | PRN

## 2017-12-10 MED ORDER — FENTANYL CITRATE (PF) 100 MCG/2ML IJ SOLN
100.0000 ug | INTRAMUSCULAR | Status: DC | PRN
  Administered 2017-12-10 (×3): 100 ug via INTRAVENOUS
  Filled 2017-12-10 (×3): qty 2

## 2017-12-10 MED ORDER — LIDOCAINE HCL (PF) 1 % IJ SOLN
30.0000 mL | INTRAMUSCULAR | Status: DC | PRN
  Filled 2017-12-10: qty 30

## 2017-12-10 MED ORDER — LACTATED RINGERS IV SOLN
INTRAVENOUS | Status: DC
  Administered 2017-12-10 (×2): via INTRAVENOUS

## 2017-12-10 MED ORDER — TERBUTALINE SULFATE 1 MG/ML IJ SOLN
0.2500 mg | Freq: Once | INTRAMUSCULAR | Status: DC | PRN
  Filled 2017-12-10: qty 1

## 2017-12-10 MED ORDER — SODIUM BICARBONATE 8.4 % IV SOLN
INTRAVENOUS | Status: DC | PRN
  Administered 2017-12-10 (×3): 3 mL via EPIDURAL

## 2017-12-10 MED ORDER — IBUPROFEN 600 MG PO TABS
600.0000 mg | ORAL_TABLET | Freq: Four times a day (QID) | ORAL | Status: DC
  Administered 2017-12-11 – 2017-12-12 (×8): 600 mg via ORAL
  Filled 2017-12-10 (×8): qty 1

## 2017-12-10 MED ORDER — ONDANSETRON HCL 4 MG/2ML IJ SOLN
4.0000 mg | Freq: Four times a day (QID) | INTRAMUSCULAR | Status: DC | PRN
  Administered 2017-12-10: 4 mg via INTRAVENOUS
  Filled 2017-12-10: qty 2

## 2017-12-10 MED ORDER — LACTATED RINGERS IV SOLN
500.0000 mL | Freq: Once | INTRAVENOUS | Status: AC
  Administered 2017-12-10: 19:00:00 via INTRAVENOUS

## 2017-12-10 NOTE — Progress Notes (Signed)
Brandi Castillo is a 27 y.o. G3P1011 at [redacted]w[redacted]d by LMP admitted for rupture of membranes  Subjective: Patient doing well. No concerns. Waiting for anesthesia for epidural   Objective: BP (!) 106/53 (BP Location: Left Arm)   Pulse 76   Temp 97.6 F (36.4 C) (Oral)   Resp 18   Ht 5' (1.524 m)   Wt 77.1 kg (170 lb)   LMP 03/07/2017   SpO2 100%   BMI 33.20 kg/m  No intake/output data recorded. No intake/output data recorded.  FHT:  FHR: 140 bpm, variability: moderate,  accelerations:  Present,  decelerations:  Present early UC:   irregular, every 1-4 minutes SVE:   Dilation: 3 Effacement (%): 100 Station: -1 Exam by:: Dr Manus Rudd  Labs: Lab Results  Component Value Date   WBC 10.9 (H) 12/10/2017   HGB 10.5 (L) 12/10/2017   HCT 31.9 (L) 12/10/2017   MCV 84.8 12/10/2017   PLT 187 12/10/2017    Assessment / Plan: Augmentation of labor, progressing well  Labor: s/p cytotec, foley bulb, and pitocin. progressing well. can increase pitocin as needed Fetal Wellbeing:  Category I Pain Control:  Epidural I/D:  n/a Anticipated MOD:  NSVD  Brandi Castillo Brandi Castillo 12/10/2017, 9:27 PM

## 2017-12-10 NOTE — H&P (Signed)
OBSTETRIC ADMISSION HISTORY AND PHYSICAL  Brandi Castillo is a 27 y.o. female G1P1011 with IUP at [redacted]w[redacted]d by LMP presenting for spontaneous rupture of membranes. She reports a big gush of fluid at 0130 and contractions that started after that. She reports +FMs, no VB, no blurry vision, headaches or peripheral edema, and RUQ pain.  She plans on breast and bottle feeding. She request Nexplanon for birth control. She received her prenatal care at [facility]  Dating: By LMP --->  Estimated Date of Delivery: 12/12/17  Clinic  [facility] Prenatal Labs  Dating  LMP Blood type: A/Positive/-- (07/23 1658)   Genetic Screen  AFP: neg     NIPS:Mat21: negative Antibody:Negative (07/23 1658)  Anatomic [place] Normal at 18 wks; female fetus Rubella: <0.90 (07/23 1658)  GTT A1C:5.0            Third trimester: WNL RPR: Non Reactive (10/17 1045)   Flu vaccine declined HBsAg: Negative (07/23 1658)   TDaP vaccine  09/13/17     Rhogam:n/a A+ HIV: Non Reactive (10/17 1045) neg  Baby Food  Breat/Bottle                                             DGL:OVFIEPPI (12/12 1610)  Contraception Nexplanon Pap:06/21/17: normal  Circumcision N/a female   Pediatrician [facility] CF:neg  Support Person Spouse-Braxton SMA: neg  Prenatal Classes declined Hgb electrophoresis:normal   Prenatal History/Complications:  Past Medical History: Past Medical History:  Diagnosis Date  . Anxiety   . Bacterial infection   . H/O varicella     Past Surgical History: History reviewed. No pertinent surgical history.  Obstetrical History: OB History    Gravida Para Term Preterm AB Living   3 1 1  0 1 1   SAB TAB Ectopic Multiple Live Births   1 0 0 0 1      Social History: Social History   Socioeconomic History  . Marital status: Single    Spouse name: None  . Number of children: None  . Years of education: None  . Highest education level: None  Social Needs  . Financial resource strain: None  . Food insecurity - worry: None   . Food insecurity - inability: None  . Transportation needs - medical: None  . Transportation needs - non-medical: None  Occupational History  . None  Tobacco Use  . Smoking status: Never Smoker  . Smokeless tobacco: Never Used  Substance and Sexual Activity  . Alcohol use: No  . Drug use: No  . Sexual activity: None  Other Topics Concern  . None  Social History Narrative  . None    Family History: Family History  Problem Relation Age of Onset  . Asthma Mother   . Depression Mother   . Alcohol abuse Mother   . Alcohol abuse Father   . Asthma Sister   . Asthma Brother   . Diabetes Maternal Grandmother   . Alcohol abuse Maternal Grandmother   . Hypertension Maternal Grandfather   . Alcohol abuse Maternal Grandfather   . Alcohol abuse Paternal Grandmother   . Diabetes Paternal Grandfather   . Alcohol abuse Paternal Grandfather     Allergies: Allergies  Allergen Reactions  . Sulfa Drugs Cross Reactors Rash    Medications Prior to Admission  Medication Sig Dispense Refill Last Dose  . Elastic Bandages & Supports (COMFORT FIT  MATERNITY SUPP MED) MISC Wear daily when ambulating 1 each 0 Taking  . Elastic Bandages & Supports (MEDICAL COMPRESSION SOCKS) MISC 1 Units by Does not apply route daily. 2 each 0 Taking  . omeprazole (PRILOSEC) 20 MG capsule Take 1 capsule (20 mg total) by mouth 2 (two) times daily before a meal. (Patient not taking: Reported on 12/10/2017) 60 capsule 5 Not Taking at Unknown time  . Prenatal-DSS-FeCb-FeGl-FA (CITRANATAL BLOOM) 90-1 MG TABS Take 1 tablet by mouth daily. (Patient not taking: Reported on 12/10/2017) 30 tablet 12 Not Taking at Unknown time  . Vitamin D, Ergocalciferol, (DRISDOL) 50000 units CAPS capsule Take 1 capsule (50,000 Units total) by mouth every 7 (seven) days. (Patient not taking: Reported on 12/10/2017) 30 capsule 2 Not Taking at Unknown time   Review of Systems   All systems reviewed and negative except as stated in  HPI  Blood pressure 139/87, pulse 71, temperature 98.4 F (36.9 C), temperature source Oral, resp. rate 16, weight 170 lb (77.1 kg), last menstrual period 03/07/2017, SpO2 100 %, unknown if currently breastfeeding. General appearance: alert, cooperative and no distress Lungs: clear to auscultation bilaterally Heart: regular rate and rhythm Abdomen: soft, non-tender; bowel sounds normal Pelvic: n/a Extremities: Homans sign is negative, no sign of DVT DTR's +2 Presentation: cephalic Fetal monitoringBaseline: 120 bpm, Variability: Good {> 6 bpm), Accelerations: Reactive and Decelerations: Absent Uterine activityFrequency: Every 6-10 minutes    Prenatal labs: ABO, Rh: --/--/A POS (01/11 1610) Antibody: NEG (01/11 0744) Rubella: <0.90 (07/23 1658) RPR: Non Reactive (10/17 1045)  HBsAg: Negative (07/23 1658)  HIV: Non Reactive (10/17 1045)  GBS: Negative (12/14 0000)   Prenatal Transfer Tool  Maternal Diabetes: No Genetic Screening: Normal Maternal Ultrasounds/Referrals: Normal Fetal Ultrasounds or other Referrals:  None Maternal Substance Abuse:  No Significant Maternal Medications:  None Significant Maternal Lab Results: Lab values include: Group B Strep negative  Results for orders placed or performed during the hospital encounter of 12/10/17 (from the past 24 hour(s))  POCT fern test   Collection Time: 12/10/17  6:52 AM  Result Value Ref Range   POCT Fern Test Positive = ruptured amniotic membanes   CBC   Collection Time: 12/10/17  7:44 AM  Result Value Ref Range   WBC 10.9 (H) 4.0 - 10.5 K/uL   RBC 3.76 (L) 3.87 - 5.11 MIL/uL   Hemoglobin 10.5 (L) 12.0 - 15.0 g/dL   HCT 31.9 (L) 36.0 - 46.0 %   MCV 84.8 78.0 - 100.0 fL   MCH 27.9 26.0 - 34.0 pg   MCHC 32.9 30.0 - 36.0 g/dL   RDW 14.1 11.5 - 15.5 %   Platelets 187 150 - 400 K/uL  Type and screen [place]   Collection Time: 12/10/17  7:44 AM  Result Value Ref Range   ABO/RH(D) A POS     Antibody Screen NEG    Sample Expiration 12/13/2017     Patient Active Problem List   Diagnosis Date Noted  . Low vitamin D level 06/24/2017  . Rubella non-immune status, antepartum 06/24/2017  . Maternal varicella, non-immune 06/24/2017  . Supervision of other normal pregnancy, antepartum 06/21/2017  . History of anxiety 03/08/2012  . Family history of breast cancer 03/08/2012    Assessment/Plan:  Deitra Craine is a 27 y.o. G3P1011 at [redacted]w[redacted]d here for spontaneous rupture of membranes  #Labor: Expectant management, pitocin if needed #Pain: Plans epidural #FWB: Cat 1 #ID:  GBS neg #MOF: Breast/bottle #MOC: Nexplanon #Circ:  Susan Moore, CNM  12/10/2017, 9:27 AM

## 2017-12-10 NOTE — H&P (Signed)
OBSTETRIC ADMISSION HISTORY AND PHYSICAL  Brandi Castillo is a 27 y.o. female G3P1011 with IUP at [redacted]w[redacted]d by LMP presenting for SROM. She reports +FMs, large LOF at 0130 with small tinge of blood, onset of contractions around 0200. She denies VB, no blurry vision, headaches or peripheral edema, and RUQ pain.  She plans on breast & bottle feeding. She requests nexplanon for birth control. She received her prenatal [facility] at [facility]   Dating: By LMP --->  Estimated Date of Delivery: 12/12/17. Of note, she had a spontaneous abortion a month prior to new onset pregnancy, with menstrual period in between used for LMP.  Sono:  @[redacted]w[redacted]d , CWD, normal anatomy, variable presentation, 269g, 51% EFW   Prenatal History/Complications:  Past Medical History: Past Medical History:  Diagnosis Date  . Anxiety   . Bacterial infection   . H/O varicella     Past Surgical History: History reviewed. No pertinent surgical history.  Obstetrical History: OB History    Gravida Para Term Preterm AB Living   3 1 1  0 1 1   SAB TAB Ectopic Multiple Live Births   1 0 0 0 1      Social History: Social History   Socioeconomic History  . Marital status: Single    Spouse name: None  . Number of children: None  . Years of education: None  . Highest education level: None  Social Needs  . Financial resource strain: None  . Food insecurity - worry: None  . Food insecurity - inability: None  . Transportation needs - medical: None  . Transportation needs - non-medical: None  Occupational History  . None  Tobacco Use  . Smoking status: Never Smoker  . Smokeless tobacco: Never Used  Substance and Sexual Activity  . Alcohol use: No  . Drug use: No  . Sexual activity: Yes    Partners: Female    Birth control/protection: None  Other Topics Concern  . None  Social History Narrative  . None    Family History: Family History  Problem Relation Age of Onset  . Asthma Mother   . Depression Mother   .  Alcohol abuse Mother   . Alcohol abuse Father   . Asthma Sister   . Asthma Brother   . Diabetes Maternal Grandmother   . Alcohol abuse Maternal Grandmother   . Hypertension Maternal Grandfather   . Alcohol abuse Maternal Grandfather   . Alcohol abuse Paternal Grandmother   . Diabetes Paternal Grandfather   . Alcohol abuse Paternal Grandfather     Allergies: Allergies  Allergen Reactions  . Sulfa Drugs Cross Reactors Rash    Medications Prior to Admission  Medication Sig Dispense Refill Last Dose  . Elastic Bandages & Supports (COMFORT FIT MATERNITY SUPP MED) MISC Wear daily when ambulating 1 each 0 Taking  . Elastic Bandages & Supports (MEDICAL COMPRESSION SOCKS) MISC 1 Units by Does not apply route daily. 2 each 0 Taking  . omeprazole (PRILOSEC) 20 MG capsule Take 1 capsule (20 mg total) by mouth 2 (two) times daily before a meal. (Patient not taking: Reported on 12/10/2017) 60 capsule 5 Not Taking at Unknown time  . Prenatal-DSS-FeCb-FeGl-FA (CITRANATAL BLOOM) 90-1 MG TABS Take 1 tablet by mouth daily. (Patient not taking: Reported on 12/10/2017) 30 tablet 12 Not Taking at Unknown time  . Vitamin D, Ergocalciferol, (DRISDOL) 50000 units CAPS capsule Take 1 capsule (50,000 Units total) by mouth every 7 (seven) days. (Patient not taking: Reported on 12/10/2017) 30 capsule 2  Not Taking at Unknown time     Review of Systems   All systems reviewed and negative except as stated in HPI  Blood pressure 132/84, pulse 75, temperature 98.2 F (36.8 C), temperature source Oral, resp. rate 18, height 5' (1.524 m), weight 77.1 kg (170 lb), last menstrual period 03/07/2017, SpO2 100 %, unknown if currently breastfeeding. General appearance: alert, cooperative, appears stated age and no distress Lungs: clear to auscultation bilaterally Heart: regular rate and rhythm Abdomen: soft, non-tender; bowel sounds normal Extremities: Homans sign is negative, no sign of DVT Presentation: unsure Fetal  monitoringBaseline: 130 bpm, Variability: Good {> 6 bpm), Accelerations: Reactive and Decelerations: Absent Uterine activityFrequency: Every 3-6 minutes Dilation: 1 Effacement (%): 50 Station: -3 Exam by:: A showfety RN   Prenatal labs: ABO, Rh: --/--/A POS (01/11 4982) Antibody: NEG (01/11 0744) Rubella: <0.90 (07/23 1658) RPR: Non Reactive (10/17 1045)  HBsAg: Negative (07/23 1658)  HIV: Non Reactive (10/17 1045)  GBS: Negative (12/14 0000)  1 hr Glucola WNL Genetic screening  neg Anatomy US WNL  Prenatal Transfer Tool  Maternal Diabetes: No Genetic Screening: Normal Maternal Ultrasounds/Referrals: Normal Fetal Ultrasounds or other Referrals:  None Maternal Substance Abuse:  No Significant Maternal Medications:  None Significant Maternal Lab Results: Lab values include: Group B Strep negative  Results for orders placed or performed during the hospital encounter of 12/10/17 (from the past 24 hour(s))  POCT fern test   Collection Time: 12/10/17  6:52 AM  Result Value Ref Range   POCT Fern Test Positive = ruptured amniotic membanes   CBC   Collection Time: 12/10/17  7:44 AM  Result Value Ref Range   WBC 10.9 (H) 4.0 - 10.5 K/uL   RBC 3.76 (L) 3.87 - 5.11 MIL/uL   Hemoglobin 10.5 (L) 12.0 - 15.0 g/dL   HCT 31.9 (L) 36.0 - 46.0 %   MCV 84.8 78.0 - 100.0 fL   MCH 27.9 26.0 - 34.0 pg   MCHC 32.9 30.0 - 36.0 g/dL   RDW 14.1 11.5 - 15.5 %   Platelets 187 150 - 400 K/uL  Type and screen [place]   Collection Time: 12/10/17  7:44 AM  Result Value Ref Range   ABO/RH(D) A POS    Antibody Screen NEG    Sample Expiration 12/13/2017     Patient Active Problem List   Diagnosis Date Noted  . Uterine contractions during pregnancy 12/10/2017  . Low vitamin D level 06/24/2017  . Rubella non-immune status, antepartum 06/24/2017  . Maternal varicella, non-immune 06/24/2017  . Supervision of other normal pregnancy, antepartum 06/21/2017  . History of  anxiety 03/08/2012  . Family history of breast cancer 03/08/2012    Assessment/Plan:  Brandi Castillo is a 27 y.o. G3P1011 at [redacted]w[redacted]d here for SROM.  #Labor: SROM at 0100. Consistent contractions, will hold cytotec and plan for FB placement. #Pain: controlled #FWB: CAT I NST #ID:  GBS -  #MOF: breast & bottle #MOC:nexplanon #Circ:  n/a  Larwance Rote, MD  12/10/2017, 10:41 AM

## 2017-12-10 NOTE — Anesthesia Pain Management Evaluation Note (Signed)
  CRNA Pain Management Visit Note  Patient: Brandi Castillo, 27 y.o., female  "Hello I am a member of the anesthesia team at [facility]. We have an anesthesia team available at all times to provide care throughout the hospital, including epidural management and anesthesia for C-section. I don't know your plan for the delivery whether it a natural birth, water birth, IV sedation, nitrous supplementation, doula or epidural, but we want to meet your pain goals."   1.Was your pain managed to your expectations on prior hospitalizations?   Yes   2.What is your expectation for pain management during this hospitalization?     Labor support without medications  3.How can we help you reach that goal?   Record the patient's initial score and the patient's pain goal.   Pain: 8  Pain Goal: 10 [facility] wants you to be able to say your pain was always managed very well.  Jabier Mutton 12/10/2017

## 2017-12-10 NOTE — Progress Notes (Signed)
Labor Progress Note Dakotah Heiman is a 27 y.o. G3P1011 at [redacted]w[redacted]d presented for SROM. S: Patient doing well, no complaints at this time.  O:  BP 111/82   Pulse 82   Temp 98.2 F (36.8 C) (Oral)   Resp 16   Ht 5' (1.524 m)   Wt 77.1 kg (170 lb)   LMP 03/07/2017   SpO2 100%   BMI 33.20 kg/m  EFM: 135/moderate/+accels, isolated variable Toco: CTX Q34min  CVE: Dilation: 1.5 Effacement (%): 70 Cervical Position: Posterior Station: -3 Presentation: Vertex Exam by:: A showfety RN   A&P: 27 y.o. [id number] [redacted]w[redacted]d here for SROM #Labor: Progressing well. FB placed around 1315.  #Pain: controlled #FWB: reactive NST #GBS negative  Larwance Rote, MD 2:03 PM

## 2017-12-10 NOTE — Progress Notes (Signed)
Labor Progress Note Brandi Castillo is a 27 y.o. G3P1011 at [redacted]w[redacted]d presented for  SROM.   S: Patient is uncomfortable with FB but tolerating, feeling some leaking around the bag.  O:  BP 132/81   Pulse 65   Temp 98.1 F (36.7 C) (Oral)   Resp 16   Ht 5' (1.524 m)   Wt 77.1 kg (170 lb)   LMP 03/07/2017   SpO2 100%   BMI 33.20 kg/m  EFM: 130/mod var/+accels, no decels  CVE: Dilation: 1.5 Effacement (%): 70 Cervical Position: Posterior Station: -3 Presentation: Vertex Exam by:: A showfety RN   A&P: 27 y.o. G3P1011 [redacted]w[redacted]d  SROM.  #Labor: Progressing well. FB still in place #Pain: plans for epidural #FWB: Cat I NST #GBS negative  Larwance Rote, MD 6:19 PM

## 2017-12-10 NOTE — Anesthesia Preprocedure Evaluation (Signed)
Anesthesia Evaluation  Patient identified by MRN, date of birth, ID band  History of Anesthesia Complications Negative for: history of anesthetic complications  Airway Mallampati: II       Dental no notable dental hx.    Pulmonary neg pulmonary ROS,    Pulmonary exam normal        Cardiovascular negative cardio ROS Normal cardiovascular exam Rhythm:Regular     Neuro/Psych    GI/Hepatic negative GI ROS, Neg liver ROS,   Endo/Other  negative endocrine ROS  Renal/GU negative Renal ROS  negative genitourinary   Musculoskeletal negative musculoskeletal ROS (+)   Abdominal Normal abdominal exam  (+)   Peds negative pediatric ROS (+)  Hematology negative hematology ROS (+)   Anesthesia Other Findings   Reproductive/Obstetrics (+) Pregnancy                             Anesthesia Physical Anesthesia Plan  ASA: II  Anesthesia Plan: Epidural   Post-op Pain Management:    Induction:   PONV Risk Score and Plan:   Airway Management Planned:   Additional Equipment:   Intra-op Plan:   Post-operative Plan:   Informed Consent:   Plan Discussed with:   Anesthesia Plan Comments:         Anesthesia Quick Evaluation

## 2017-12-10 NOTE — Progress Notes (Signed)
Labor Progress Note Brandi Castillo is a 27 y.o. G3P1011 at [redacted]w[redacted]d presented for SROM S:Patient feeling very uncomfortable  O:  BP (!) 150/95   Pulse (!) 104   Temp 98.5 F (36.9 C) (Oral)   Resp 20   Ht 5' (1.524 m)   Wt 77.1 kg (170 lb)   LMP 03/07/2017   SpO2 100%   BMI 33.20 kg/m    CVE: Dilation: 3 Effacement (%): 100 Cervical Position: Posterior Station: -1 Presentation: Vertex Exam by:: Dr Manus Rudd   A&P: 27 y.o. Q8G5003 [redacted]w[redacted]d here for SROM. #Labor: Removed FB due to significant amount of pain patient reporting. Start pitocin. Stop cytotec. #Pain: planning for epidural   Dannielle Huh, DO 6:41 PM

## 2017-12-10 NOTE — Anesthesia Procedure Notes (Signed)
Epidural Patient location during procedure: OB  Staffing Anesthesiologist: Clydell Hakim, MD Performed: anesthesiologist   Preanesthetic Checklist Completed: patient identified, site marked, surgical consent, pre-op evaluation, timeout performed, IV checked, risks and benefits discussed and monitors and equipment checked  Epidural Patient position: sitting Prep: DuraPrep Patient monitoring: heart rate, continuous pulse ox and blood pressure Location: L3-L4 Injection technique: LOR saline  Needle:  Needle type: Tuohy  Needle gauge: 18 G Needle length: 9 cm and 9 Catheter type: closed end flexible Catheter size: 20 Guage Test dose: 2% lidocaine with Epi 1:200 K and positive  Assessment Events: blood not aspirated, injection not painful, no injection resistance, negative IV test and no paresthesia  Additional Notes Patient identified. Risks/Benefits/Options discussed with patient including but not limited to bleeding, infection, nerve damage, paralysis, failed block, incomplete pain control, headache, blood pressure changes, nausea, vomiting, reactions to medication both or allergic, itching and postpartum back pain. Confirmed with bedside nurse the patient's most recent platelet count. Confirmed with patient that they are not currently taking any anticoagulation, have any bleeding history or any family history of bleeding disorders. Patient expressed understanding and wished to proceed. All questions were answered. Sterile technique was used throughout the entire procedure. Please see nursing notes for vital signs. Test dose was given through epidural catheter and patient experienced warmth, numbness advancing in legs. Catheter aspirated and returned clear fluid. D/c'd catheter, laid pt. down. Advised pt of likely spinal dose; she is comfortable and willing to monitor, reattempt epidural when analgesia abates. Pt told of possibility of PD puncture headache. All questions and concerns  addressed with instructions to call with any issues.

## 2017-12-10 NOTE — MAU Note (Signed)
PT states SROM at 0130; clear fluid. Cnxt 3-5 mins apart since 0200. Denies bleeding. +FM

## 2017-12-10 NOTE — Anesthesia Procedure Notes (Signed)
Epidural Patient location during procedure: OB Start time: 12/10/2017 10:12 PM  Staffing Anesthesiologist: Clydell Hakim, MD Performed: anesthesiologist   Preanesthetic Checklist Completed: patient identified, site marked, surgical consent, pre-op evaluation, timeout performed, IV checked, risks and benefits discussed and monitors and equipment checked  Epidural Patient position: sitting Prep: DuraPrep Patient monitoring: heart rate, continuous pulse ox and blood pressure Location: L4-L5 Injection technique: LOR saline and LOR air  Needle:  Needle type: Tuohy  Needle gauge: 18 G Needle length: 9 cm and 9 Catheter type: closed end flexible Catheter size: 20 Guage Test dose: negative and 2% lidocaine with Epi 1:200 K  Assessment Events: blood not aspirated, injection not painful, no injection resistance, negative IV test and no paresthesia  Additional Notes Patient identified. Risks/Benefits/Options discussed with patient including but not limited to bleeding, infection, nerve damage, paralysis, failed block, incomplete pain control, headache, blood pressure changes, nausea, vomiting, reactions to medication both or allergic, itching and postpartum back pain. Confirmed with bedside nurse the patient's most recent platelet count. Confirmed with patient that they are not currently taking any anticoagulation, have any bleeding history or any family history of bleeding disorders. Patient expressed understanding and wished to proceed. All questions were answered. Sterile technique was used throughout the entire procedure. Please see nursing notes for vital signs. Test dose was given through epidural catheter and negative prior to continuing to dose epidural or start infusion. Patient was given instructions on fall risk and not to get out of bed. All questions and concerns addressed with instructions to call with any issues.

## 2017-12-11 ENCOUNTER — Other Ambulatory Visit: Payer: Self-pay

## 2017-12-11 ENCOUNTER — Encounter (HOSPITAL_COMMUNITY): Payer: Self-pay | Admitting: *Deleted

## 2017-12-11 LAB — HIV ANTIBODY (ROUTINE TESTING W REFLEX): HIV SCREEN 4TH GENERATION: NONREACTIVE

## 2017-12-11 MED ORDER — WITCH HAZEL-GLYCERIN EX PADS: 1.0000 "application " | MEDICATED_PAD | CUTANEOUS | Status: DC | PRN

## 2017-12-11 MED ORDER — ZOLPIDEM TARTRATE 5 MG PO TABS: 5.0000 mg | ORAL_TABLET | Freq: Every evening | ORAL | Status: DC | PRN

## 2017-12-11 MED ORDER — SENNOSIDES-DOCUSATE SODIUM 8.6-50 MG PO TABS
2.0000 | ORAL_TABLET | ORAL | Status: DC
  Administered 2017-12-11 (×2): 2 via ORAL
  Filled 2017-12-11 (×2): qty 2

## 2017-12-11 MED ORDER — COCONUT OIL OIL: 1.0000 "application " | TOPICAL_OIL | Status: DC | PRN

## 2017-12-11 MED ORDER — TETANUS-DIPHTH-ACELL PERTUSSIS 5-2.5-18.5 LF-MCG/0.5 IM SUSP: 0.5000 mL | Freq: Once | INTRAMUSCULAR | Status: DC

## 2017-12-11 MED ORDER — DIPHENHYDRAMINE HCL 25 MG PO CAPS: 25.0000 mg | ORAL_CAPSULE | Freq: Four times a day (QID) | ORAL | Status: DC | PRN

## 2017-12-11 MED ORDER — SIMETHICONE 80 MG PO CHEW: 80.0000 mg | CHEWABLE_TABLET | ORAL | Status: DC | PRN

## 2017-12-11 MED ORDER — PRENATAL MULTIVITAMIN CH
1.0000 | ORAL_TABLET | Freq: Every day | ORAL | Status: DC
  Administered 2017-12-11 – 2017-12-12 (×2): 1 via ORAL
  Filled 2017-12-11 (×2): qty 1

## 2017-12-11 MED ORDER — MEASLES, MUMPS & RUBELLA VAC ~~LOC~~ INJ
0.5000 mL | INJECTION | Freq: Once | SUBCUTANEOUS | Status: AC
  Administered 2017-12-12: 0.5 mL via SUBCUTANEOUS
  Filled 2017-12-11 (×2): qty 0.5

## 2017-12-11 MED ORDER — ACETAMINOPHEN 325 MG PO TABS: 650.0000 mg | ORAL_TABLET | ORAL | Status: DC | PRN

## 2017-12-11 MED ORDER — DIBUCAINE 1 % RE OINT: 1.0000 "application " | TOPICAL_OINTMENT | RECTAL | Status: DC | PRN

## 2017-12-11 MED ORDER — BENZOCAINE-MENTHOL 20-0.5 % EX AERO: 1.0000 "application " | INHALATION_SPRAY | CUTANEOUS | Status: DC | PRN

## 2017-12-11 MED ORDER — ONDANSETRON HCL 4 MG/2ML IJ SOLN: 4.0000 mg | INTRAMUSCULAR | Status: DC | PRN

## 2017-12-11 MED ORDER — ONDANSETRON HCL 4 MG PO TABS: 4.0000 mg | ORAL_TABLET | ORAL | Status: DC | PRN

## 2017-12-11 NOTE — Progress Notes (Signed)
Post Partum Day 1 Subjective: no complaints and no flatus or voiding since delivery. Late delivery yesterday evening so patient has not walked much.  Objective: Blood pressure 116/68, pulse 97, temperature 98.9 F (37.2 C), temperature source Oral, resp. rate 18, height 5' (1.524 m), weight 71.2 kg (157 lb 0.7 oz), last menstrual period 03/07/2017, SpO2 100 %, unknown if currently breastfeeding.  Physical Exam:  General: alert, cooperative and no distress Lochia: appropriate Uterine Fundus: firm, above umbilicus DVT Evaluation: No evidence of DVT seen on physical exam. No cords or calf tenderness. No significant calf/ankle edema.  Recent Labs    12/10/17 0744  HGB 10.5*  HCT 31.9*    Assessment/Plan: Plan for discharge tomorrow, Breastfeeding and Contraception Nexplanon   LOS: 1 day   Sherin Abraham 12/11/2017, 6:15 AM   OB FELLOW POSTPARTUM PROGRESS NOTE ATTESTATION I have seen and examined this patient and agree with above documentation in the resident's note.   Gailen Shelter, MD OB Fellow 9:59 AM

## 2017-12-11 NOTE — Clinical Social Work Maternal (Signed)
  CLINICAL SOCIAL WORK MATERNAL/CHILD NOTE  Patient Details  Name: Brandi Castillo MRN: [id number] Date of Birth: February 26, 1991  Date:  12/11/2017  Clinical Social Worker Initiating Note:  Emmani Lesueur lcsw Date/Time: Initiated:  12/11/17/      Child's Name:  Brandi Castillo   Biological Parents:  Mother, Father, Other (Comment)(grandmother)   Need for Interpreter:  None   Reason for Referral:  Other (Comment)(score of 9 on edinburgh)   Address:  [place] [address]    Phone number:  [phone] (home)     Additional phone number:   Household Members/Support Persons (HM/SP):   Household Member/Support Person 1   HM/SP Name Relationship DOB or Age  HM/SP -1 [place] significant other    HM/SP -2        HM/SP -3        HM/SP -4        HM/SP -5        HM/SP -6        HM/SP -7        HM/SP -8          Natural Supports (not living in the home):  Immediate Family, Extended Family, Parent   Professional Supports: None   Employment:     Type of Work:     Education:  [place] arranged:    Museum/gallery curator Resources:  Multimedia programmer   Other Resources:      Cultural/Religious Considerations Which May Impact Care:    Strengths:  Ability to meet basic needs , Home prepared for child    Psychotropic Medications:         Pediatrician:       Pediatrician List:   [place]      Pediatrician Fax Number:    Risk Factors/Current Problems:  None   Cognitive State:  Able to Concentrate , Alert , Goal Oriented , Insightful    Mood/Affect:  Bright , Comfortable , Calm , Relaxed    CSW Assessment: LCSW consulted due to edinburgh score of 9.  LCSW met with MOB, FOB and grandmother of baby at bedside to assess for services.  MOB reported that this was her 2nd child and stated she had a 25 year old girl.  MOB reported that she would be off  from work and could bond with her newborn for awhile before going back to work.  MOB reported that she had much support from her mother and her significant other who lived with her.  MOB reported that she had all of the equipment needed to take her newborn home.  MOB reported that she felt calm and settled and was not concerned with anything at this time.  LCSW provided education on post partem depression and provided MOB and family with a chart to keep track of any negative symptoms.  LCSW encouraged MOB and family to follow up with MD should she experience negative symptoms that don't go away.  LCSW also provided MOB with a list of support groups that she may attend for additional support.  MOB declined any need for mental health or medication services and was thankful for LCSW support.    CSW Plan/Description:  No Further Intervention Required/No Barriers to Discharge    Carlean Jews, LCSW 12/11/2017, 3:54 PM

## 2017-12-11 NOTE — Lactation Note (Signed)
This note was copied from a baby's chart. Lactation Consultation Note  Patient Name: Brandi Castillo [id number] Date: 12/11/2017 Reason for consult: Initial assessment  80 hours old 32w 5d female who is being exclusively BF by her mother. Mom feels confident about BF and she said that even though she only BF her first child for 6 weeks she was planning on BF this baby much longer. Initiated STS before attempting latching baby on breast, baby was too sleepy and unable to sustain latch. She was very gassy and it seemed like she was regurgitating when trying to burp her. Advised mom to put baby on the breast again on next feeding and if she's still sleepy and doesn't latch to hand express or pump and feed that fresh expressed milk to the baby. Pumping and hand expression were discussed as well as spoon feeding. Mom was encouraged to feed on cues instead of on schedule, she's aware of OP services as well as the BF support group.   Maternal Data Formula Feeding for Exclusion: No Has patient been taught Hand Expression?: Yes Does the patient have breastfeeding experience prior to this delivery?: Yes  Feeding Feeding Type: Breast Fed Length of feed: 1 min  LATCH Score Latch: Repeated attempts needed to sustain latch, nipple held in mouth throughout feeding, stimulation needed to elicit sucking reflex.  Audible Swallowing: A few with stimulation  Type of Nipple: Everted at rest and after stimulation  Comfort (Breast/Nipple): Soft / non-tender  Hold (Positioning): Assistance needed to correctly position infant at breast and maintain latch.  LATCH Score: 7  Interventions Interventions: Assisted with latch;Breast feeding basics reviewed;Skin to skin;Breast massage;Breast compression;Hand express;Adjust position;Support pillows;Hand pump  Lactation Tools Discussed/Used Tools: Pump Breast pump type: Manual WIC Program: Yes Pump Review: Setup, frequency, and cleaning;Milk  Storage Initiated by:: MPeck Date initiated:: 12/11/17   Consult Status Consult Status: Follow-up Date: 12/12/17 Follow-up type: In-patient    Laniah Grimm Francene Boyers 12/11/2017, 7:39 PM

## 2017-12-11 NOTE — Anesthesia Postprocedure Evaluation (Signed)
Anesthesia Post Note  Patient: Brandi Castillo  Procedure(s) Performed: AN [place]     Patient location during evaluation: Mother Baby Anesthesia Type: Epidural Level of consciousness: awake and alert Pain management: pain level controlled Vital Signs Assessment: post-procedure vital signs reviewed and stable Respiratory status: spontaneous breathing, nonlabored ventilation and respiratory function stable Cardiovascular status: stable Postop Assessment: no headache, no backache, epidural receding and patient able to bend at knees Anesthetic complications: no    Last Vitals:  Vitals:   12/11/17 0100 12/11/17 0434  BP: 112/63 116/68  Pulse: 92 97  Resp: 18 18  Temp: 36.8 C 37.2 C  SpO2:      Last Pain:  Vitals:   12/11/17 0758  TempSrc:   PainSc: 0-No pain   Pain Goal:                 Rayvon Char

## 2017-12-12 LAB — BIRTH TISSUE RECOVERY COLLECTION (PLACENTA DONATION)

## 2017-12-12 NOTE — Discharge Summary (Signed)
OB Discharge Summary     Patient Name: Brandi Castillo DOB: 06/20/1991 MRN: [id number]  Date of admission: 12/10/2017 Delivering MD: Gailen Shelter   Date of discharge: 12/12/2017  Admitting diagnosis: 39 WEEKS CTX Intrauterine pregnancy: [redacted]w[redacted]d     Secondary diagnosis:  Principal Problem:   Vaginal delivery Active Problems:   Uterine contractions during pregnancy  Additional problems: none     Discharge diagnosis: Term Pregnancy Delivered                                                                                                Post partum procedures:none  Augmentation: Pitocin, Cytotec and Foley Balloon  Complications: None  Hospital course:  Onset of Labor With Vaginal Delivery     27 y.o. yo [id number] at [redacted]w[redacted]d was admitted in Latent Labor on 12/10/2017. Patient had an uncomplicated labor course as follows:  Membrane Rupture Time/Date: 1:00 AM ,12/10/2017   Intrapartum Procedures: Episiotomy: None [1]                                         Lacerations:  None [1]  Patient had a delivery of a Viable infant. 12/10/2017  Information for the patient's newborn:  Brandi, [place] Girl Castillo [[id number]]  Delivery Method: Vag-Spont    Pateint had an uncomplicated postpartum course.  She is ambulating, tolerating a regular diet, passing flatus, and urinating well. Patient is discharged home in stable condition on 12/12/17.   Physical exam  Vitals:   12/11/17 0500 12/11/17 1130 12/11/17 1809 12/12/17 0500  BP:  104/64 120/71 115/71  Pulse:  78 78 68  Resp:  16 18 18   Temp:  98.9 F (37.2 C) 98.2 F (36.8 C) 98.2 F (36.8 C)  TempSrc:  Oral Oral Oral  SpO2:  99%    Weight: 157 lb 0.7 oz (71.2 kg)   159 lb (72.1 kg)  Height:       General: alert, cooperative and no distress Lochia: appropriate Uterine Fundus: firm Incision: N/A DVT Evaluation: No evidence of DVT seen on physical exam. Negative Homan's sign. Calf/Ankle edema is present Labs: Lab Results   Component Value Date   WBC 10.9 (H) 12/10/2017   HGB 10.5 (L) 12/10/2017   HCT 31.9 (L) 12/10/2017   MCV 84.8 12/10/2017   PLT 187 12/10/2017   CMP Latest Ref Rng & Units 11/20/2011  Glucose 70 - 99 mg/dL 84  BUN 6 - 23 mg/dL 6  Creatinine 0.50 - 1.10 mg/dL 0.50  Sodium 135 - 145 mEq/L 135  Potassium 3.5 - 5.1 mEq/L 4.0  Chloride 96 - 112 mEq/L 102  CO2 19 - 32 mEq/L 24  Calcium 8.4 - 10.5 mg/dL 9.4    Discharge instruction: per After Visit Summary and "Baby and Me Booklet".   Diet: routine diet  Activity: Advance as tolerated. Pelvic rest for 6 weeks.   Outpatient follow up:4 weeks  Follow up Appt: Future Appointments  Date Time Provider [place]  01/10/2018  8:45 AM Margurite Auerbach,  Rachelle A, CNM [facility] None  02/16/2018  8:40 AM Nahser, Wonda Cheng, MD [facility]   Postpartum contraception: Nexplanon  Newborn Data: Live born female  Birth Weight: 6 lb 15.3 oz (3155 g) APGAR: 44, 9  Newborn Delivery   Birth date/time:  12/10/2017 22:52:00 Delivery type:  Vaginal, Spontaneous     Baby Feeding: Breast Disposition:home with mother   12/12/2017 Lajean Manes, CNM

## 2017-12-15 ENCOUNTER — Encounter: Payer: Self-pay | Admitting: Certified Nurse Midwife

## 2017-12-19 ENCOUNTER — Inpatient Hospital Stay (HOSPITAL_COMMUNITY): Admission: RE | Admit: 2017-12-19 | Payer: Managed Care, Other (non HMO) | Source: Ambulatory Visit

## 2018-01-04 ENCOUNTER — Emergency Department (HOSPITAL_COMMUNITY)
Admission: EM | Admit: 2018-01-04 | Discharge: 2018-01-04 | Disposition: A | Discharge status: Home / Self Care | Payer: Medicaid Other | Attending: Emergency Medicine | Admitting: Emergency Medicine

## 2018-01-04 DIAGNOSIS — R509 Fever, unspecified: Secondary | ICD-10-CM | POA: Diagnosis present

## 2018-01-04 DIAGNOSIS — Z5321 Procedure and treatment not carried out due to patient leaving prior to being seen by health care provider: Secondary | ICD-10-CM | POA: Diagnosis not present

## 2018-01-04 NOTE — ED Notes (Signed)
Patient signed out AMA stating that she will no longer wait.

## 2018-01-10 ENCOUNTER — Encounter: Payer: Self-pay | Admitting: Certified Nurse Midwife

## 2018-01-10 ENCOUNTER — Ambulatory Visit (INDEPENDENT_AMBULATORY_CARE_PROVIDER_SITE_OTHER): Payer: Medicaid Other | Admitting: Certified Nurse Midwife

## 2018-01-10 DIAGNOSIS — Z30013 Encounter for initial prescription of injectable contraceptive: Secondary | ICD-10-CM

## 2018-01-10 DIAGNOSIS — Z3202 Encounter for pregnancy test, result negative: Secondary | ICD-10-CM | POA: Diagnosis not present

## 2018-01-10 DIAGNOSIS — Z1389 Encounter for screening for other disorder: Secondary | ICD-10-CM

## 2018-01-10 LAB — POCT URINE PREGNANCY: Preg Test, Ur: NEGATIVE

## 2018-01-10 MED ORDER — MEDROXYPROGESTERONE ACETATE 150 MG/ML IM SUSP: 150.0000 mg | INTRAMUSCULAR | 4 refills | Status: DC

## 2018-01-10 NOTE — Addendum Note (Signed)
Addended by: Marquette Old on: 01/10/2018 04:42 PM   Modules accepted: Orders

## 2018-01-10 NOTE — Progress Notes (Signed)
Post Partum Exam  Brandi Castillo is a 27 y.o. G62P2012 female who presents for a postpartum visit. She is 4 weeks postpartum following a spontaneous vaginal delivery. I have fully reviewed the prenatal and intrapartum course. The delivery was at 67 gestational weeks.  Anesthesia: epidural. Postpartum course has been unremarkable. Baby's course has been unremarkable. Baby is feeding by both breast and bottle - Breast Milk only used Formula twice . Bleeding no bleeding. Bowel function is normal. Bladder function is normal. Patient is not sexually active. Contraception method is none.unsure if she wants Nexplanon or depo Postpartum depression screening:neg EPDS: 3  The following portions of the patient's history were reviewed and updated as appropriate: allergies, current medications, past family history, past medical history, past social history, past surgical history and problem list. Last pap smear done 06/21/17 and was Normal  Review of Systems Pertinent items noted in HPI and remainder of comprehensive ROS otherwise negative.    Objective:  Last menstrual period 03/07/2017, unknown if currently breastfeeding.  General:  alert, cooperative and no distress   Breasts:  inspection negative, no nipple discharge or bleeding, no masses or nodularity palpable  Lungs: clear to auscultation bilaterally  Heart:  regular rate and rhythm, S1, S2 normal, no murmur, click, rub or gallop  Abdomen: soft, non-tender; bowel sounds normal; no masses,  no organomegaly  Pelvic/Rectal Exam: Not performed.        Assessment:    Normal 4 week postpartum exam. Pap smear not done at today's visit.   Plan:   1. Contraception: abstinence 2. F/U with next period for Depo injection, does not want it today.  3. Follow up in: 4 weeks for Depo injection or with next period or as needed.

## 2018-01-17 DIAGNOSIS — Z3483 Encounter for supervision of other normal pregnancy, third trimester: Secondary | ICD-10-CM

## 2018-02-16 ENCOUNTER — Ambulatory Visit: Payer: Medicaid Other | Admitting: Cardiovascular Disease

## 2018-02-17 ENCOUNTER — Encounter: Payer: Self-pay | Admitting: Cardiovascular Disease

## 2018-11-18 ENCOUNTER — Other Ambulatory Visit: Payer: Self-pay | Admitting: Family Medicine

## 2018-11-18 DIAGNOSIS — E049 Nontoxic goiter, unspecified: Secondary | ICD-10-CM

## 2019-01-24 ENCOUNTER — Other Ambulatory Visit: Payer: Self-pay

## 2019-01-24 DIAGNOSIS — I83893 Varicose veins of bilateral lower extremities with other complications: Secondary | ICD-10-CM

## 2019-01-30 ENCOUNTER — Encounter: Payer: Self-pay | Admitting: Surgery

## 2019-01-30 ENCOUNTER — Ambulatory Visit (HOSPITAL_COMMUNITY)
Admission: RE | Admit: 2019-01-30 | Discharge: 2019-01-30 | Disposition: A | Discharge status: Home / Self Care | Payer: No Typology Code available for payment source | Source: Ambulatory Visit | Attending: Surgery | Admitting: Surgery

## 2019-01-30 ENCOUNTER — Other Ambulatory Visit: Payer: Self-pay

## 2019-01-30 ENCOUNTER — Ambulatory Visit (INDEPENDENT_AMBULATORY_CARE_PROVIDER_SITE_OTHER): Payer: No Typology Code available for payment source | Admitting: Surgery

## 2019-01-30 VITALS — BP 131/81 | HR 85 | Temp 97.7°F | Resp 14 | Ht 60.0 in | Wt 156.0 lb

## 2019-01-30 DIAGNOSIS — I83893 Varicose veins of bilateral lower extremities with other complications: Secondary | ICD-10-CM | POA: Diagnosis present

## 2019-01-30 NOTE — Progress Notes (Signed)
Vascular and Vein Specialist of [facility]  Patient name: Brandi Castillo MRN: [id number] DOB: Aug 04, 1991 Sex: female   REQUESTING PROVIDER:    Marda Stalker   REASON FOR CONSULT:    Varicose veins  HISTORY OF PRESENT ILLNESS:   Brandi Castillo is a 28 y.o. female, who is here today for evaluation of her varicose veins.  She has had 2 children and they have gotten worse with these pregnancy.  They now cause her pain especially when touched.  She does have mild swelling.  She has not been wearing compression stockings.  She does not have a history of DVT.  PAST MEDICAL HISTORY    Past Medical History:  Diagnosis Date  . Anxiety   . Bacterial infection   . H/O varicella      FAMILY HISTORY   Family History  Problem Relation Age of Onset  . Asthma Mother   . Depression Mother   . Alcohol abuse Mother   . Alcohol abuse Father   . Asthma Sister   . Asthma Brother   . Diabetes Maternal Grandmother   . Alcohol abuse Maternal Grandmother   . Hypertension Maternal Grandfather   . Alcohol abuse Maternal Grandfather   . Alcohol abuse Paternal Grandmother   . Diabetes Paternal Grandfather   . Alcohol abuse Paternal Grandfather     SOCIAL HISTORY:   Social History   Socioeconomic History  . Marital status: Single    Spouse name: Not on file  . Number of children: Not on file  . Years of education: Not on file  . Highest education level: Not on file  Occupational History  . Not on file  Social Needs  . Financial resource strain: Not on file  . Food insecurity:    Worry: Not on file    Inability: Not on file  . Transportation needs:    Medical: Not on file    Non-medical: Not on file  Tobacco Use  . Smoking status: Never Smoker  . Smokeless tobacco: Never Used  Substance and Sexual Activity  . Alcohol use: No  . Drug use: No  . Sexual activity: Yes    Partners: Female    Birth control/protection: None    Lifestyle  . Physical activity:    Days per week: Not on file    Minutes per session: Not on file  . Stress: Not on file  Relationships  . Social connections:    Talks on phone: Not on file    Gets together: Not on file    Attends religious service: Not on file    Active member of club or organization: Not on file    Attends meetings of clubs or organizations: Not on file    Relationship status: Not on file  . Intimate partner violence:    Fear of current or ex partner: Not on file    Emotionally abused: Not on file    Physically abused: Not on file    Forced sexual activity: Not on file  Other Topics Concern  . Not on file  Social History Narrative  . Not on file    ALLERGIES:    Allergies  Allergen Reactions  . Sulfa Drugs Cross Reactors Rash    CURRENT MEDICATIONS:    Current Outpatient Medications  Medication Sig Dispense Refill  . Elastic Bandages & Supports (MEDICAL COMPRESSION SOCKS) MISC 1 Units by Does not apply route daily. 2 each 0  . Vitamin D, Ergocalciferol, (DRISDOL) 50000 units CAPS capsule  Take 1 capsule (50,000 Units total) by mouth every 7 (seven) days. 30 capsule 2  . medroxyPROGESTERone (DEPO-PROVERA) 150 MG/ML injection Inject 1 mL (150 mg total) into the muscle every 3 (three) months. (Patient not taking: Reported on 01/30/2019) 1 mL 4  . omeprazole (PRILOSEC) 20 MG capsule Take 1 capsule (20 mg total) by mouth 2 (two) times daily before a meal. (Patient not taking: Reported on 01/30/2019) 60 capsule 5  . Prenatal-DSS-FeCb-FeGl-FA (CITRANATAL BLOOM) 90-1 MG TABS Take 1 tablet by mouth daily. (Patient not taking: Reported on 01/30/2019) 30 tablet 12   No current facility-administered medications for this visit.     REVIEW OF SYSTEMS:   [X]  denotes positive finding, [ ]  denotes negative finding Cardiac  Comments:  Chest pain or chest pressure: x   Shortness of breath upon exertion: x   Short of breath when lying flat:    Irregular heart rhythm:         Vascular    Pain in calf, thigh, or hip brought on by ambulation:    Pain in feet at night that wakes you up from your sleep:     Blood clot in your veins:    Leg swelling:         Pulmonary    Oxygen at home:    Productive cough:     Wheezing:         Neurologic    Sudden weakness in arms or legs:     Sudden numbness in arms or legs:     Sudden onset of difficulty speaking or slurred speech:    Temporary loss of vision in one eye:     Problems with dizziness:         Gastrointestinal    Blood in stool:      Vomited blood:         Genitourinary    Burning when urinating:     Blood in urine:        Psychiatric    Major depression:         Hematologic    Bleeding problems:    Problems with blood clotting too easily:        Skin    Rashes or ulcers:        Constitutional    Fever or chills:     PHYSICAL EXAM:   Vitals:   01/30/19 1400  BP: 131/81  Pulse: 85  Resp: 14  Temp: 97.7 F (36.5 C)  TempSrc: Oral  SpO2: 97%  Weight: 156 lb (70.8 kg)  Height: 5' (1.524 m)    GENERAL: The patient is a well-nourished female, in no acute distress. The vital signs are documented above. CARDIAC: There is a regular rate and rhythm.  VASCULAR: Spider and reticular veins on bilateral lateral thigh.  Trace edema. PULMONARY: Nonlabored respirations.  MUSCULOSKELETAL: There are no major deformities or cyanosis. NEUROLOGIC: No focal weakness or paresthesias are detected. SKIN: There are no ulcers or rashes noted. PSYCHIATRIC: The patient has a normal affect.  STUDIES:   I have ordered and reviewed her u/s with the following findings: Venous Reflux Times Normal value < 0.5 sec +------------------------------+----------+---------+                               Right (ms)Left (ms) +------------------------------+----------+---------+ GSV at Saphenofemoral junction1533.00   587.00    +------------------------------+----------+---------+ GSV mid calf  5919.00             +------------------------------+----------+---------+  Vein Diameters: +------------------------------+----------+---------+                               Right (cm)Left (cm) +------------------------------+----------+---------+ GSV at Saphenofemoral junction0.489     0.727     +------------------------------+----------+---------+ GSV at prox thigh             0.445     0.371     +------------------------------+----------+---------+ GSV at mid thigh              0.318     0.350     +------------------------------+----------+---------+ GSV at distal thigh           0.246     0.337     +------------------------------+----------+---------+ GSV at knee                   0.369     0.314     +------------------------------+----------+---------+ GSV prox calf                 0.427     0.223     +------------------------------+----------+---------+ GSV mid calf                  0.247     0.310     +------------------------------+----------+---------+ GSV dist calf                                     +------------------------------+----------+---------+ SSV origin                    0.438     0.250     +------------------------------+----------+---------+ SSV prox                      0.456     0.277     +------------------------------+----------+---------+ SSV mid                       0.313     0.116     +------------------------------+----------+---------+       ASSESSMENT and PLAN   Venous insufficiency with painful reticular and spider veins: I do not think that saphenous vein is large enough to consider endovenous laser ablation at this time although there is reflux present.  I have recommended skin laser treatment and sclerotherapy for spider and reticular veins.  I examined the patient with Penni Homans who is in agreement.  We will try to get the schedule within the next few weeks.  We did discuss  the possibility of recurrence.  The patient does not anticipate having any additional children however we did state that this would increase the risk of recurrence if she did.  She will need to get thigh-high compression stockings if she elects to proceed with treatment.   Annamarie Major, MD Vascular and Vein Specialists of [facility] [phone] Pager [phone]

## 2019-09-21 IMAGING — US US MFM OB FOLLOW-UP
1 series · 14 of 20 positions shown · non-contrast
Comparison: none

[Series 1: us mfm ob follow-up · 14 of 20 slices shown]
[im 1/20]
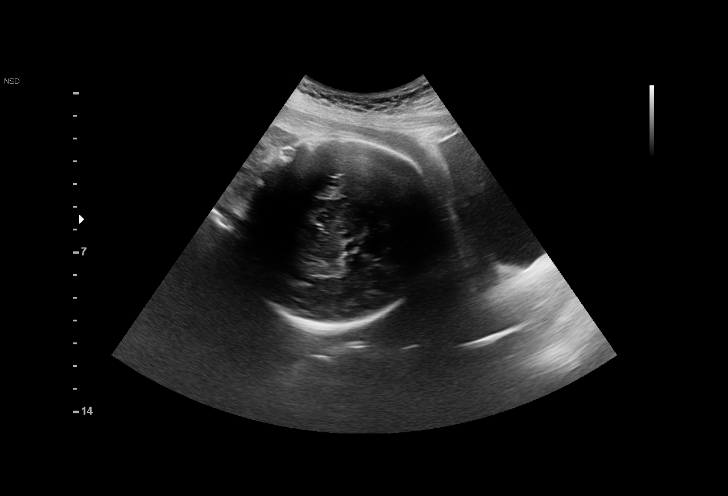
[im 3/20]
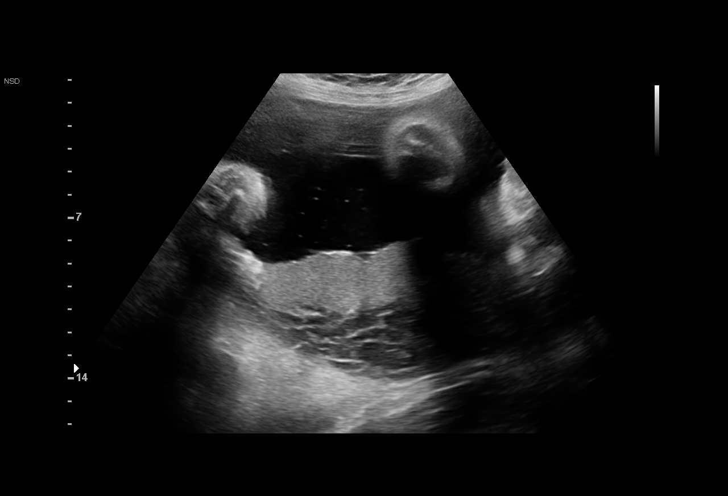
[im 4/20]
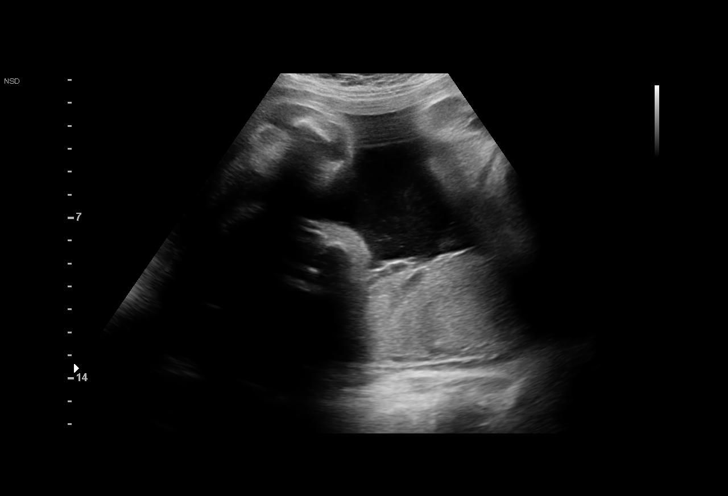
[im 6/20]
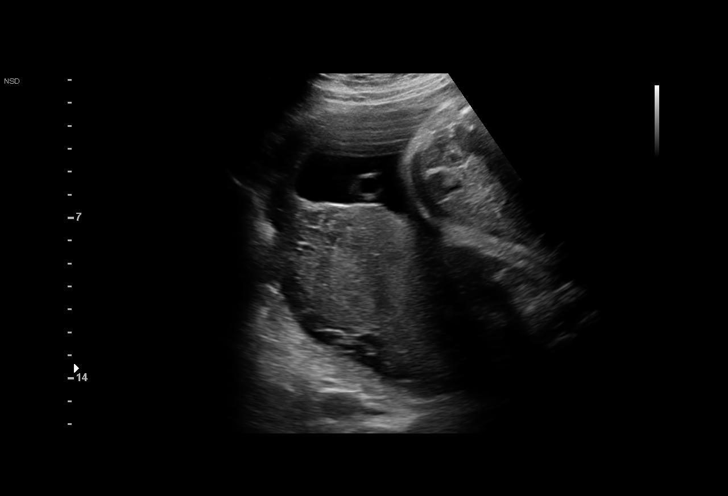
[im 7/20]
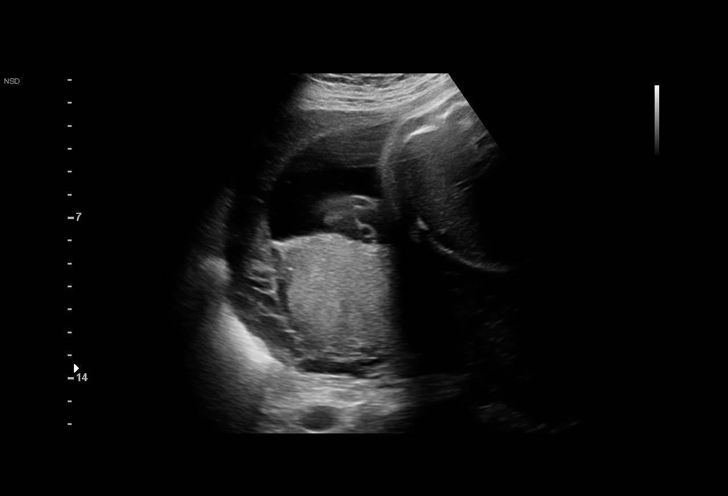
[im 8/20]
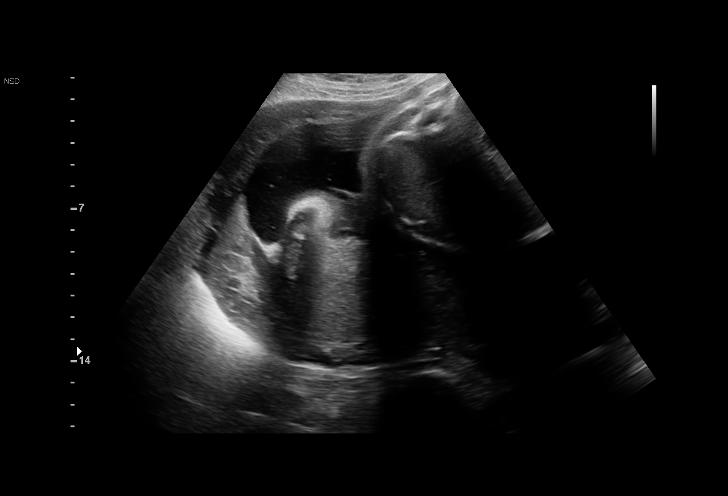
[im 10/20]
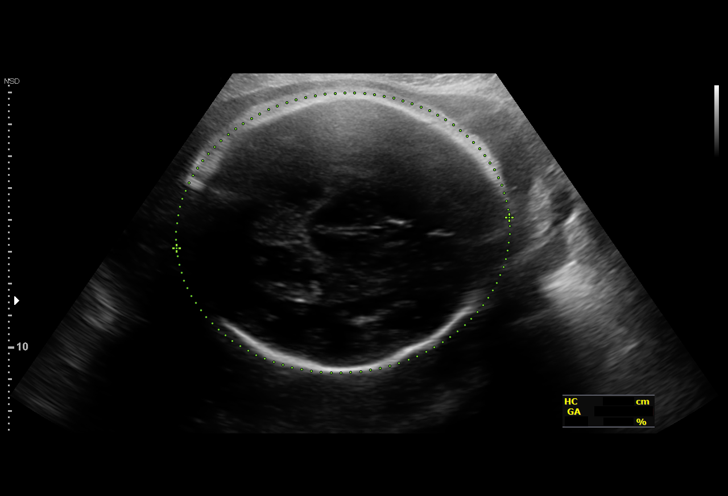
[im 11/20]
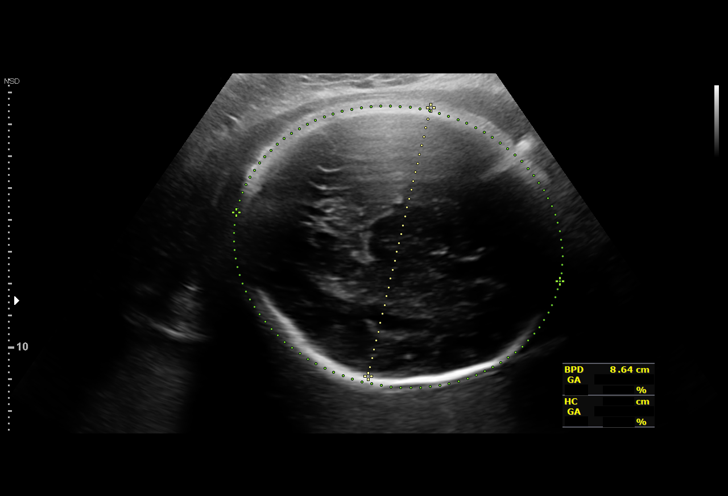
[im 13/20]
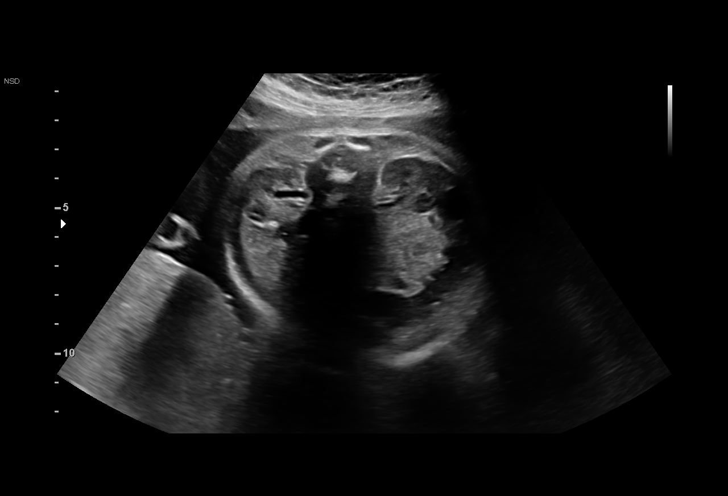
[im 14/20]
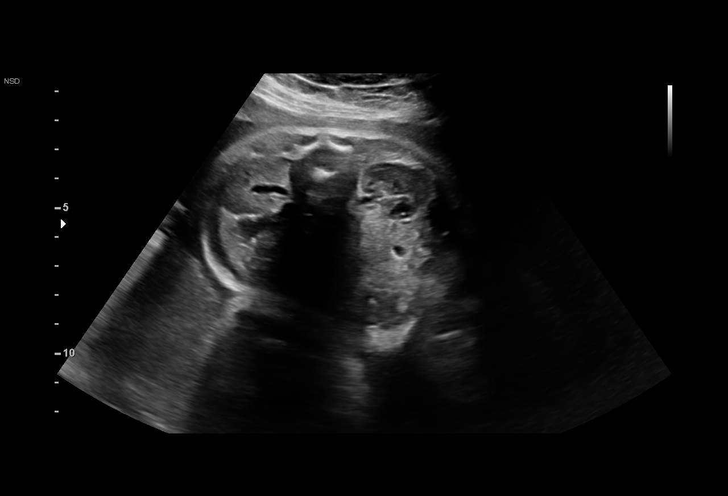
[im 16/20]
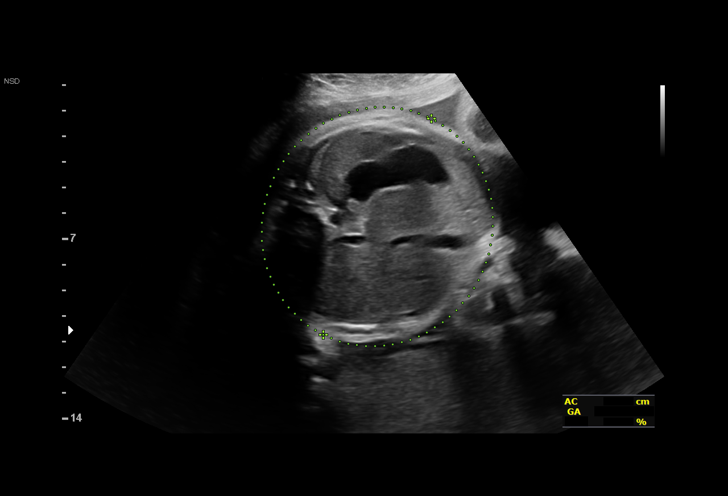
[im 17/20]
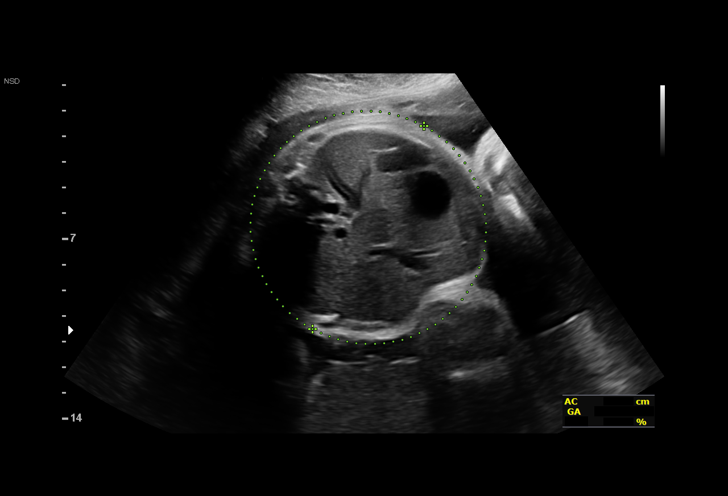
[im 18/20]
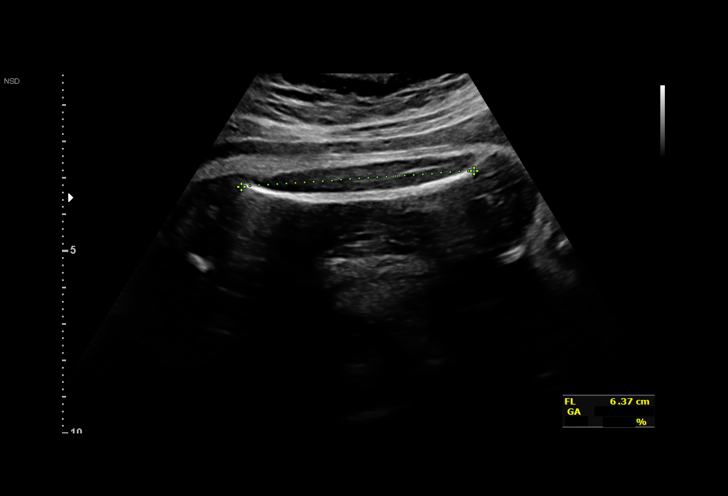
[im 20/20]
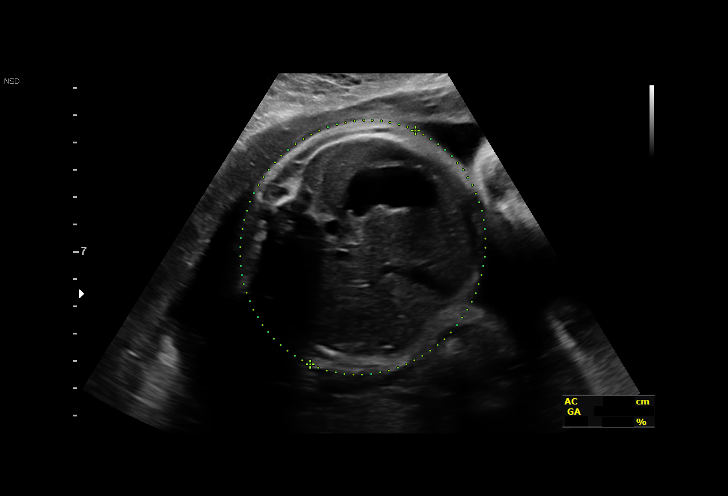

[14 of 20 positions shown; findings below may reference images not displayed]

Road [HOSPITAL]

1  LUO SANMARTIN           [id number]      [id number]     [id number]
Indications

33 weeks gestation of pregnancy
Encounter for other antenatal screening
follow-up
Uterine size-date discrepancy, third trimester
(S>D)
OB History

Gravidity:    3         Term:   1        Prem:   0        SAB:   1
TOP:          0       Ectopic:  0        Living: 1
Fetal Evaluation

Num Of Fetuses:     1
Fetal Heart         128
Rate(bpm):
Cardiac Activity:   Observed
Presentation:       Cephalic
Placenta:           Posterior, above cervical os
P. Cord Insertion:  Previously seen as normal

Amniotic Fluid
AFI FV:      Subjectively within normal limits

AFI Sum(cm)     %Tile       Largest Pocket(cm)
17.27           63

RUQ(cm)       RLQ(cm)       LUQ(cm)        LLQ(cm)
4.84
Biometry

BPD:      86.4  mm     G. Age:  34w 6d         84  %    CI:        81.35   %    70 - 86
FL/HC:      21.0   %    19.9 -
HC:      302.4  mm     G. Age:  33w 4d         19  %    HC/AC:      1.05        0.96 -
AC:      286.9  mm     G. Age:  32w 5d         31  %    FL/BPD:     73.4   %    71 - 87
FL:       63.4  mm     G. Age:  32w 6d         23  %    FL/AC:      22.1   %    20 - 24

Est. FW:    4124  gm    4 lb 10 oz      50  %
Gestational Age

LMP:           33w 3d        Date:  03/07/17                 EDD:   12/12/17
U/S Today:     33w 4d                                        EDD:   12/11/17
Best:          33w 3d     Det. By:  LMP  (03/07/17)          EDD:   12/12/17
Anatomy

Cranium:               Appears normal         Aortic Arch:            Previously seen
Cavum:                 Previously seen        Ductal Arch:            Previously seen
Ventricles:            Appears normal         Diaphragm:              Appears normal
Choroid Plexus:        Previously seen        Stomach:                Appears normal, left
sided
Cerebellum:            Previously seen        Abdomen:                Appears normal
Posterior Fossa:       Previously seen        Abdominal Wall:         Previously seen
Nuchal Fold:           Previously seen        Cord Vessels:           Previously seen
Face:                  Orbits and profile     Kidneys:                Appear normal
previously seen
Lips:                  Previously seen        Bladder:                Appears normal
Thoracic:              Appears normal         Spine:                  Previously seen
Heart:                 Appears normal         Upper Extremities:      Previously seen
(4CH, axis, and
situs)
RVOT:                  Previously seen        Lower Extremities:      Previously seen
LVOT:                  Previously seen

Other:  Female gender previously seen. Heels, LT 5th digit, and Nasal bone
previously visualized.
Cervix Uterus Adnexa

Cervix
Not visualized (advanced GA >81wks)
Impression

SIUP at 33w3d (remote read of ultrasound images only)
active fetus
EFW 50th%'le
no defects seen
no previa
Recommendations

Follow up as clinically indicated.

## 2020-04-17 ENCOUNTER — Other Ambulatory Visit: Payer: Self-pay

## 2020-04-17 ENCOUNTER — Encounter (HOSPITAL_BASED_OUTPATIENT_CLINIC_OR_DEPARTMENT_OTHER): Payer: Self-pay | Admitting: *Deleted

## 2020-04-17 DIAGNOSIS — W57XXXA Bitten or stung by nonvenomous insect and other nonvenomous arthropods, initial encounter: Secondary | ICD-10-CM | POA: Insufficient documentation

## 2020-04-17 DIAGNOSIS — R2243 Localized swelling, mass and lump, lower limb, bilateral: Secondary | ICD-10-CM | POA: Insufficient documentation

## 2020-04-17 DIAGNOSIS — L089 Local infection of the skin and subcutaneous tissue, unspecified: Secondary | ICD-10-CM | POA: Insufficient documentation

## 2020-04-17 NOTE — ED Triage Notes (Signed)
Pt c/o insect bite to left foot and redness and pain to right big toe x 1 week

## 2020-04-18 ENCOUNTER — Emergency Department (HOSPITAL_BASED_OUTPATIENT_CLINIC_OR_DEPARTMENT_OTHER)
Admission: EM | Admit: 2020-04-18 | Discharge: 2020-04-18 | Disposition: A | Discharge status: Home / Self Care | Payer: No Typology Code available for payment source | Attending: Emergency Medicine | Admitting: Emergency Medicine

## 2020-04-18 DIAGNOSIS — W57XXXA Bitten or stung by nonvenomous insect and other nonvenomous arthropods, initial encounter: Secondary | ICD-10-CM

## 2020-04-18 MED ORDER — CEPHALEXIN 500 MG PO CAPS: 500.0000 mg | ORAL_CAPSULE | Freq: Four times a day (QID) | ORAL | 0 refills | Status: DC

## 2020-04-18 MED ORDER — FLUCONAZOLE 150 MG PO TABS: ORAL_TABLET | ORAL | 0 refills | Status: DC

## 2020-04-18 NOTE — ED Provider Notes (Signed)
[place] DE[facility] Provider Note: Georgena Spurling, MD, FACEP  CSN: [id number] MRN: [id number] ARRIVAL: 04/17/20 at 2237 ROOM: [place] PRESENT ILLNESS  04/18/20 3:51 AM Brandi Castillo is a 29 y.o. female who states she has some type of insect infestation in her new apartment.  She believes she has been bitten on her feet by insects or spiders.  She has pain and swelling to her dorsal feet bilaterally.  The left foot has been affected for 2 days, the right foot became infected yesterday.  There is a bulla surrounding the base of the nail of the right great toe.  She rates associated pain is a 7 out of 10, worse with ambulation.  There is also associated itching.  She has not had a fever.   Past Medical History:  Diagnosis Date  . Anxiety   . Bacterial infection   . H/O varicella     History reviewed. No pertinent surgical history.  Family History  Problem Relation Age of Onset  . Asthma Mother   . Depression Mother   . Alcohol abuse Mother   . Alcohol abuse Father   . Asthma Sister   . Asthma Brother   . Diabetes Maternal Grandmother   . Alcohol abuse Maternal Grandmother   . Hypertension Maternal Grandfather   . Alcohol abuse Maternal Grandfather   . Alcohol abuse Paternal Grandmother   . Diabetes Paternal Grandfather   . Alcohol abuse Paternal Grandfather     Social History   Tobacco Use  . Smoking status: Never Smoker  . Smokeless tobacco: Never Used  Substance Use Topics  . Alcohol use: No  . Drug use: No    Prior to Admission medications   Medication Sig Start Date End Date Taking? Authorizing Provider  cephALEXin (KEFLEX) 500 MG capsule Take 1 capsule (500 mg total) by mouth 4 (four) times daily. 04/18/20   Marcedes Tech, Jenny Reichmann, MD  Elastic Bandages & Supports (MEDICAL COMPRESSION SOCKS) MISC 1 Units by Does not apply route daily. 06/21/17   Kandis Cocking A, CNM  fluconazole (DIFLUCAN) 150 MG tablet Take 1  tablet as needed for vaginal yeast infection.  May repeat in 3 days if symptoms persist. 04/18/20   Carolin Quang, Jenny Reichmann, MD  Vitamin D, Ergocalciferol, (DRISDOL) 50000 units CAPS capsule Take 1 capsule (50,000 Units total) by mouth every 7 (seven) days. 06/24/17   Kandis Cocking A, CNM  medroxyPROGESTERone (DEPO-PROVERA) 150 MG/ML injection Inject 1 mL (150 mg total) into the muscle every 3 (three) months. Patient not taking: Reported on 01/30/2019 01/10/18 04/18/20  Morene Crocker, CNM  omeprazole (PRILOSEC) 20 MG capsule Take 1 capsule (20 mg total) by mouth 2 (two) times daily before a meal. Patient not taking: Reported on 01/30/2019 11/10/17 04/18/20  Kandis Cocking A, CNM    Allergies Sulfa drugs cross reactors   REVIEW OF SYSTEMS  Negative except as noted here or in the History of Present Illness.   PHYSICAL EXAMINATION  Initial Vital Signs Blood pressure 131/80, pulse (!) 102, temperature 98.3 F (36.8 C), temperature source Oral, resp. rate 18, height 5' (1.524 m), weight 77.1 kg, last menstrual period 04/11/2020, SpO2 100 %, unknown if currently breastfeeding.  Examination General: Well-developed, well-nourished female in no acute distress; appearance consistent with age of record HENT: normocephalic; atraumatic Eyes: Normal appearance Neck: supple Heart: regular rate and rhythm Lungs: clear to auscultation bilaterally Abdomen: soft; nondistended; nontender; bowel sounds present Extremities: No deformity;  full range of motion; pulses normal Neurologic: Awake, alert and oriented; motor function intact in all extremities and symmetric; no facial droop Skin: Warm and dry; erythema and tenderness of dorsal feet bilaterally with bulla at the base of the right great toenail:    Psychiatric: Normal mood and affect   RESULTS  Summary of this visit's results, reviewed and interpreted by myself:   EKG Interpretation  Date/Time:    Ventricular Rate:    PR Interval:    QRS  Duration:   QT Interval:    QTC Calculation:   R Axis:     Text Interpretation:        Laboratory Studies: No results found for this or any previous visit (from the past 24 hour(s)). Imaging Studies: No results found.  ED COURSE and MDM  Nursing notes, initial and subsequent vitals signs, including pulse oximetry, reviewed and interpreted by myself.  Vitals:   04/17/20 2248  BP: 131/80  Pulse: (!) 102  Resp: 18  Temp: 98.3 F (36.8 C)  TempSrc: Oral  SpO2: 100%  Weight: 77.1 kg  Height: 5' (1.524 m)   Medications - No data to display  This could represent some type of insect or spider envenomation in which case no specific treatment is available (the patient was advised to use over-the-counter antihistamines as needed for itching).  This may represent a secondary cellulitis and we will treat with Keflex for this possibility.  PROCEDURES  Procedures   ED DIAGNOSES     ICD-10-CM   1. Bug bite with infection, initial encounter  [email or web address].Paulette Blanch, Eshani Maestre, MD 04/18/20 0400

## 2020-07-24 ENCOUNTER — Telehealth: Payer: Self-pay | Admitting: *Deleted

## 2020-07-24 NOTE — Telephone Encounter (Signed)
Returning patient phone call.  Not able to talk with patient.  Left telephone voice message for her to call me back.

## 2020-07-27 ENCOUNTER — Other Ambulatory Visit: Payer: Self-pay

## 2020-07-27 ENCOUNTER — Emergency Department (HOSPITAL_COMMUNITY)
Admission: EM | Admit: 2020-07-27 | Discharge: 2020-07-27 | Disposition: A | Discharge status: Home / Self Care | Payer: No Typology Code available for payment source | Attending: Emergency Medicine | Admitting: Emergency Medicine

## 2020-07-27 ENCOUNTER — Encounter (HOSPITAL_COMMUNITY): Payer: Self-pay

## 2020-07-27 DIAGNOSIS — N939 Abnormal uterine and vaginal bleeding, unspecified: Secondary | ICD-10-CM | POA: Insufficient documentation

## 2020-07-27 DIAGNOSIS — O0480 (Induced) termination of pregnancy with unspecified complications: Secondary | ICD-10-CM | POA: Diagnosis not present

## 2020-07-27 LAB — CBC
HCT: 39.8 % (ref 36.0–46.0)
HCT: 31.1 % — ABNORMAL LOW (ref 36.0–46.0)
Hemoglobin: 12.4 g/dL (ref 12.0–15.0)
Hemoglobin: 9.8 g/dL — ABNORMAL LOW (ref 12.0–15.0)
MCH: 28.3 pg (ref 26.0–34.0)
MCH: 29.3 pg (ref 26.0–34.0)
MCHC: 31.2 g/dL (ref 30.0–36.0)
MCHC: 31.5 g/dL (ref 30.0–36.0)
MCV: 90.9 fL (ref 80.0–100.0)
MCV: 93.1 fL (ref 80.0–100.0)
Platelets: 262 10*3/uL (ref 150–400)
Platelets: 223 10*3/uL (ref 150–400)
RBC: 4.38 MIL/uL (ref 3.87–5.11)
RBC: 3.34 MIL/uL — ABNORMAL LOW (ref 3.87–5.11)
RDW: 12.8 % (ref 11.5–15.5)
RDW: 13.1 % (ref 11.5–15.5)
WBC: 12.7 10*3/uL — ABNORMAL HIGH (ref 4.0–10.5)
WBC: 13.5 10*3/uL — ABNORMAL HIGH (ref 4.0–10.5)
nRBC: 0 % (ref 0.0–0.2)
nRBC: 0 % (ref 0.0–0.2)

## 2020-07-27 LAB — PROTIME-INR
INR: 0.9 (ref 0.8–1.2)
Prothrombin Time: 11.8 seconds (ref 11.4–15.2)

## 2020-07-27 LAB — I-STAT BETA HCG BLOOD, ED (MC, WL, AP ONLY): I-stat hCG, quantitative: 5.4 m[IU]/mL — ABNORMAL HIGH (ref <5)

## 2020-07-27 MED ORDER — KETOROLAC TROMETHAMINE 30 MG/ML IJ SOLN
15.0000 mg | Freq: Once | INTRAMUSCULAR | Status: AC | PRN
  Administered 2020-07-27: 15 mg via INTRAVENOUS
  Filled 2020-07-27: qty 1

## 2020-07-27 MED ORDER — MEGESTROL ACETATE 40 MG PO TABS
120.0000 mg | ORAL_TABLET | Freq: Once | ORAL | Status: AC
  Administered 2020-07-27: 120 mg via ORAL
  Filled 2020-07-27: qty 3

## 2020-07-27 MED ORDER — SODIUM CHLORIDE 0.9 % IV BOLUS
1000.0000 mL | Freq: Once | INTRAVENOUS | Status: AC
  Administered 2020-07-27: 1000 mL via INTRAVENOUS

## 2020-07-27 MED ORDER — OXYCODONE-ACETAMINOPHEN 5-325 MG PO TABS
1.0000 | ORAL_TABLET | Freq: Once | ORAL | Status: AC
  Administered 2020-07-27: 1 via ORAL
  Filled 2020-07-27: qty 1

## 2020-07-27 MED ORDER — MEGESTROL ACETATE 40 MG PO TABS: 120.0000 mg | ORAL_TABLET | Freq: Every day | ORAL | 0 refills | Status: DC

## 2020-07-27 NOTE — Discharge Instructions (Signed)
Take Megace as prescribed and call the office of Dr. Nelda Marseille on Monday to schedule close follow up in the office. Take Tylenol for management of cramping. Return to the ED for new or concerning symptoms such as: loss of consciousness, fever, shortness of breath, or saturating more than 1+ pad per hour for 3+ consecutive hours.

## 2020-07-27 NOTE — ED Notes (Signed)
HCG result: 5.4

## 2020-07-27 NOTE — ED Provider Notes (Signed)
[facility] EMERGENCY DEPARTMENT Provider Note   CSN: [id number] Arrival date & time: 07/27/20  0354     History Chief Complaint  Patient presents with  . Vaginal Bleeding    Brandi Castillo is a 29 y.o. female.  Patient is a [id number] female who presents to the ED for vaginal bleeding. She recently restarted Depo-Provera on 07/10/20 after having an elective abortion. Awoke this morning with some vaginal bleeding. Had some brown spotting while at work throughout the day. Was in the shower tonight when she felt a gush of fluid from her vaginal canal. Looked down to see that she had passed a large clot and was having continued, heavy vaginal bleeding. Reports developing associated lower abdominal cramping which has spontaneously improved. Felt lightheaded with EMS and nauseated. No syncope or vomiting. Denies fevers or hx of trauma contributing to bleeding tonight. No medications taken PTA. Denies use of chronic anticoagulation as well as any history of bleeding disorders.  Followed by Dr. Janyth Pupa at [facility]   Vaginal Bleeding      Past Medical History:  Diagnosis Date  . Anxiety   . Bacterial infection   . H/O varicella     Patient Active Problem List   Diagnosis Date Noted  . Low vitamin D level 06/24/2017  . Maternal varicella, non-immune 06/24/2017  . History of anxiety 03/08/2012  . Family history of breast cancer 03/08/2012    History reviewed. No pertinent surgical history.   OB History    Gravida  3   Para  2   Term  2   Preterm  0   AB  1   Living  2     SAB  1   TAB  0   Ectopic  0   Multiple  0   Live Births  2           Family History  Problem Relation Age of Onset  . Asthma Mother   . Depression Mother   . Alcohol abuse Mother   . Alcohol abuse Father   . Asthma Sister   . Asthma Brother   . Diabetes Maternal Grandmother   . Alcohol abuse Maternal Grandmother   . Hypertension Maternal Grandfather    . Alcohol abuse Maternal Grandfather   . Alcohol abuse Paternal Grandmother   . Diabetes Paternal Grandfather   . Alcohol abuse Paternal Grandfather     Social History   Tobacco Use  . Smoking status: Never Smoker  . Smokeless tobacco: Never Used  Substance Use Topics  . Alcohol use: No  . Drug use: No    Home Medications Prior to Admission medications   Medication Sig Start Date End Date Taking? Authorizing Provider  ELDERBERRY PO Take 1 each by mouth daily.   Yes [provider]  cephALEXin (KEFLEX) 500 MG capsule Take 1 capsule (500 mg total) by mouth 4 (four) times daily. Patient not taking: Reported on 07/27/2020 04/18/20   Molpus, Jenny Reichmann, MD  Elastic Bandages & Supports (MEDICAL COMPRESSION SOCKS) MISC 1 Units by Does not apply route daily. 06/21/17   Kandis Cocking A, CNM  megestrol (MEGACE) 40 MG tablet Take 3 tablets (120 mg total) by mouth daily. 07/27/20   Antonietta Breach, PA-C  medroxyPROGESTERone (DEPO-PROVERA) 150 MG/ML injection Inject 1 mL (150 mg total) into the muscle every 3 (three) months. Patient not taking: Reported on 01/30/2019 01/10/18 04/18/20  Morene Crocker, CNM  omeprazole (PRILOSEC) 20 MG capsule Take 1 capsule (20  mg total) by mouth 2 (two) times daily before a meal. Patient not taking: Reported on 01/30/2019 11/10/17 04/18/20  Kandis Cocking A, CNM    Allergies    Sulfa drugs cross reactors  Review of Systems   Review of Systems  Genitourinary: Positive for vaginal bleeding.  Ten systems reviewed and are negative for acute change, except as noted in the HPI.    Physical Exam Updated Vital Signs BP 107/65   Pulse 72   Temp 98.7 F (37.1 C) (Oral)   Resp 15   Ht 5' (1.524 m)   Wt 80.7 kg   SpO2 97%   BMI 34.76 kg/m   Physical Exam Vitals and nursing note reviewed.  Constitutional:      General: She is not in acute distress.    Appearance: She is well-developed. She is not diaphoretic.     Comments: Nontoxic appearing and in  NAD  HENT:     Head: Normocephalic and atraumatic.  Eyes:     General: No scleral icterus.    Conjunctiva/sclera: Conjunctivae normal.  Cardiovascular:     Rate and Rhythm: Normal rate and regular rhythm.     Pulses: Normal pulses.  Pulmonary:     Effort: Pulmonary effort is normal. No respiratory distress.     Comments: Respirations even and unlabored Abdominal:     Palpations: Abdomen is soft.     Comments: Mild lower abdominal TTP without guarding. No peritoneal signs.  Genitourinary:    Comments: Exam chaperoned by RN, Jinny Blossom. Small amount of blood and clots in vaginal vault. Evacuated without evidence of continued bleeding. Cervical os is closed. No friability. No evidence of trauma. Normal external genitalia.  Musculoskeletal:        General: Normal range of motion.     Cervical back: Normal range of motion.  Skin:    General: Skin is warm and dry.     Coloration: Skin is not pale.     Findings: No erythema or rash.  Neurological:     Mental Status: She is alert and oriented to person, place, and time.     Coordination: Coordination normal.  Psychiatric:        Behavior: Behavior normal.     ED Results / Procedures / Treatments   Labs (all labs ordered are listed, but only abnormal results are displayed) Labs Reviewed  CBC - Abnormal; Notable for the following components:      Result Value   WBC 12.7 (*)    All other components within normal limits  CBC - Abnormal; Notable for the following components:   WBC 13.5 (*)    RBC 3.34 (*)    Hemoglobin 9.8 (*)    HCT 31.1 (*)    All other components within normal limits  PROTIME-INR  I-STAT BETA HCG BLOOD, ED ([facility], [facility], [facility] ONLY)    EKG None  Radiology No results found.  Procedures Procedures (including critical care time)  Medications Ordered in ED Medications  sodium chloride 0.9 % bolus 1,000 mL (0 mLs Intravenous Stopped 07/27/20 0344)  megestrol (MEGACE) tablet 120 mg (120 mg Oral Given 07/27/20 0443)    oxyCODONE-acetaminophen (PERCOCET/ROXICET) 5-325 MG per tablet 1 tablet (1 tablet Oral Given 07/27/20 0400)  sodium chloride 0.9 % bolus 1,000 mL (0 mLs Intravenous Stopped 07/27/20 0554)  ketorolac (TORADOL) 30 MG/ML injection 15 mg (15 mg Intravenous Given 07/27/20 0443)    ED Course  I have reviewed the triage vital signs and the nursing notes.  Pertinent labs & imaging results that were available during my care of the patient were reviewed by me and considered in my medical decision making (see chart for details).  Clinical Course as of Jul 28 635  Sat Jul 27, 2020  0333 Went to reassess patient. She was found to be standing next to her bed. Appears to have had another large passage of blood and clots; saturated bed sheets and chuck with blood also all over the floor. Patient c/o feeling lightheaded with additional lower abdominal cramping. Assisted back to bed and put in trendelenburg position. Will consult OB for additional recommendations.   [KH]  0345 Spoke with Dr. Elonda Husky of [facility]. He advises 120mg  PO Megestrol be given in the ED and that patient continue this dose daily x 10 days until she is able to follow up with her GYN provider.   [KH]  0357 I-Stat hCG not crossing over; results = 5.4   [KH]  0544 Patient reassessed.  She states that she is feeling better.  Pain has subsided.  She does feel as though she is still bleeding.  Will repeat pelvic exam to assess for continued bleeding.  She is presently hemodynamically stable without tachycardia or hypotension.   [KH]  0607 Repeat pelvic performed with RN as chaperone.  Large volume clots evacuated from the vaginal vault.  There is a small clot at the cervical os; however, there is no evidence of persistent, active bleeding.  Will trend CBC to ensure no significant change.  Anticipate discharge if trending work-up stable.   [KH]  [phone] Case discussed with Dr. Alwyn Pea on-call for Dr. Nelda Marseille.  Discussed with Dr. Alwyn Pea the patient's  symptoms as well as her progression in the ED.  Dr. Alwyn Pea made aware of change in hemoglobin from 12.4 down to 9.8.  Dr. Alwyn Pea expresses comfort with plan for discharge and outpatient follow-up.   [KH]    Clinical Course User Index [KH] Antonietta Breach, PA-C   MDM Rules/Calculators/A&P                          Patient with AUB which began tonight while in the shower. No associated fever, SOB, syncope. Reports resuming Depo-Provera on 07/10/20. Observed in the ED for approximately 6 hours without clinical decompensation. Hemodynamically stable. Bleeding controlled with Megace and pain controlled with Toradol, 5/325 Percocet x 1. Will plan to continue PO Megace QD, as advised by [facility], until outpatient GYN follow up. Advised on bleeding precautions. Patient discharged in stable condition with no unaddressed concerns.  Vitals:   07/27/20 0445 07/27/20 0500 07/27/20 0600 07/27/20 0630  BP: 109/74 120/73 115/62 107/65  Pulse: 96  84 72  Resp: 20 17 18 15   Temp:      TempSrc:      SpO2: 99%  100% 97%  Weight:      Height:        Final Clinical Impression(s) / ED Diagnoses Final diagnoses:  Abnormal uterine bleeding    Rx / DC Orders ED Discharge Orders         Ordered    megestrol (MEGACE) 40 MG tablet  Daily        07/27/20 0352           Antonietta Breach, PA-C 07/27/20 1245    Fatima Blank, MD 07/29/20 1133

## 2020-07-27 NOTE — ED Triage Notes (Signed)
Patient arrived EMS from home for excessive vaginal bleeding past 1 hour, noticed it during a shower. 8/10 pelvic pain.

## 2020-07-30 ENCOUNTER — Telehealth: Payer: Self-pay | Admitting: *Deleted

## 2020-07-30 NOTE — Telephone Encounter (Signed)
Returning [name]'s earlier telephone voice message regarding CO-VID vaccine.  Ms. Brandi Castillo was seen at [facility] on 01-30-2019 for reticular and spider veins.  A venous reflux was done on 01-30-2019 and no DVT was noted and the vein diameters were too small for endovenous laser ablation treatment. Sclerotherapy was discussed as possible treatment.  Ms. Brandi Castillo is calling today with concerns about "blood clots if she takes the CO-VID 19 vaccine".  Conferred with Dr. Oneida Alar about this issue and he states that DVTs have not been seen with the [facility] or Pfizer vaccines. Dr. Oneida Alar states that DVTs occur in 1 out of 2 million cases with the  J and J vaccines and AstaZeneca vaccines.  Shared this information with Ms. Brandi Castillo.  She verbalized understanding.

## 2021-03-11 DIAGNOSIS — N949 Unspecified condition associated with female genital organs and menstrual cycle: Secondary | ICD-10-CM | POA: Diagnosis not present

## 2021-04-08 ENCOUNTER — Other Ambulatory Visit: Payer: Self-pay

## 2021-04-08 DIAGNOSIS — I8390 Asymptomatic varicose veins of unspecified lower extremity: Secondary | ICD-10-CM

## 2021-04-21 ENCOUNTER — Other Ambulatory Visit: Payer: Self-pay

## 2021-04-21 ENCOUNTER — Encounter (HOSPITAL_BASED_OUTPATIENT_CLINIC_OR_DEPARTMENT_OTHER): Payer: Self-pay | Admitting: *Deleted

## 2021-04-21 ENCOUNTER — Emergency Department (HOSPITAL_BASED_OUTPATIENT_CLINIC_OR_DEPARTMENT_OTHER)
Admission: EM | Admit: 2021-04-21 | Discharge: 2021-04-21 | Disposition: A | Discharge status: Home / Self Care | Payer: BC Managed Care – PPO | Attending: Emergency Medicine | Admitting: Emergency Medicine

## 2021-04-21 ENCOUNTER — Emergency Department (HOSPITAL_BASED_OUTPATIENT_CLINIC_OR_DEPARTMENT_OTHER): Payer: BC Managed Care – PPO

## 2021-04-21 DIAGNOSIS — M79661 Pain in right lower leg: Secondary | ICD-10-CM | POA: Diagnosis not present

## 2021-04-21 DIAGNOSIS — Y990 Civilian activity done for income or pay: Secondary | ICD-10-CM | POA: Insufficient documentation

## 2021-04-21 DIAGNOSIS — M79604 Pain in right leg: Secondary | ICD-10-CM | POA: Diagnosis not present

## 2021-04-21 DIAGNOSIS — M25561 Pain in right knee: Secondary | ICD-10-CM | POA: Insufficient documentation

## 2021-04-21 NOTE — ED Triage Notes (Signed)
C/o right lower leg pain x 3 weeks , leg swelling x 2 days

## 2021-04-21 NOTE — Discharge Instructions (Addendum)
IMPORTANT PATIENT INSTRUCTIONS:  You have been scheduled for an Outpatient Ultrasound.    Your appointment has been scheduled for:  __11_AM____ am/pm on ___05/24/22___ (date).  If your appointment is scheduled for a Saturday, Sunday or holiday, please go to the [facility] Emergency Department Registration Desk at least 15 minutes prior to your appointment time and tell them you are there for an ultrasound.    If your appointment is scheduled for a weekday (Monday - Friday), please go directly to the [facility] Radiology Department reception area at least 15 minutes prior to your appointment time and tell them you are there for an ultrasound.  Please call [phone] with questions.

## 2021-04-21 NOTE — ED Provider Notes (Signed)
Emergency Department Provider Note   I have reviewed the triage vital signs and the nursing notes.   HISTORY  Chief Complaint Leg Pain   HPI Brandi Castillo is a 30 y.o. female with past medical history reviewed below presents to the emergency department with right knee pain.  She states symptoms have been intermittent over the past 3 weeks.  She thinks that she may have injured the knee slightly while at work but pain has been tolerable until the last several days.  She is had some burning discomfort along the medial right knee and feeling of some discomfort and behind the knee.  No additional injuries.  No significant leg swelling.  She not having chest pain or shortness of breath.  No history of DVT.  She is not on OCPs.    Past Medical History:  Diagnosis Date  . Anxiety   . Bacterial infection   . H/O varicella     Patient Active Problem List   Diagnosis Date Noted  . Low vitamin D level 06/24/2017  . Maternal varicella, non-immune 06/24/2017  . History of anxiety 03/08/2012  . Family history of breast cancer 03/08/2012    History reviewed. No pertinent surgical history.  Allergies Sulfa drugs cross reactors  Family History  Problem Relation Age of Onset  . Asthma Mother   . Depression Mother   . Alcohol abuse Mother   . Alcohol abuse Father   . Asthma Sister   . Asthma Brother   . Diabetes Maternal Grandmother   . Alcohol abuse Maternal Grandmother   . Hypertension Maternal Grandfather   . Alcohol abuse Maternal Grandfather   . Alcohol abuse Paternal Grandmother   . Diabetes Paternal Grandfather   . Alcohol abuse Paternal Grandfather     Social History Social History   Tobacco Use  . Smoking status: Never Smoker  . Smokeless tobacco: Never Used  Substance Use Topics  . Alcohol use: No  . Drug use: No    Review of Systems  Constitutional: No fever/chills Cardiovascular: Denies chest pain. Respiratory: Denies shortness of  breath. Gastrointestinal: No abdominal pain.   Musculoskeletal: Negative for back pain. Positive right knee pain.  Skin: Negative for rash.  ____________________________________________   PHYSICAL EXAM:  VITAL SIGNS: ED Triage Vitals  Enc Vitals Group     BP 04/21/21 2140 (!) 141/89     Pulse Rate 04/21/21 2140 95     Resp 04/21/21 2140 18     Temp 04/21/21 2140 98.8 F (37.1 C)     Temp Source 04/21/21 2140 Oral     SpO2 04/21/21 2140 100 %     Weight 04/21/21 2138 178 lb (80.7 kg)     Height 04/21/21 2138 5' (1.524 m)   Constitutional: Alert and oriented. Well appearing and in no acute distress. Eyes: Conjunctivae are normal.  Head: Atraumatic. Nose: No congestion/rhinnorhea. Mouth/Throat: Mucous membranes are moist. Neck: No stridor.   Cardiovascular: Normal rate, regular rhythm. Good peripheral circulation. Grossly normal heart sounds.   Respiratory: Normal respiratory effort.  Gastrointestinal: No distention.  Musculoskeletal: Patient has some varicose veins bilaterally but no palpable cords or superficial thrombophlebitis.  The patient has some medial right knee tenderness without erythema.  The joint is not particularly inflamed or warm. Neurologic:  Normal speech and language.  Skin:  Skin is warm, dry and intact. No rash noted.   ____________________________________________  RADIOLOGY  DG Knee Complete 4 Views Right  Result Date: 04/21/2021 CLINICAL DATA:  30 year old female  with right knee pain. EXAM: RIGHT KNEE - COMPLETE 4+ VIEW COMPARISON:  None. FINDINGS: There is no acute fracture or dislocation. The bones are well mineralized. No arthritic changes. No joint effusion. The soft tissues are unremarkable. IMPRESSION: Negative. Electronically Signed   By: Anner Crete M.D.   On: 04/21/2021 22:20    ____________________________________________   PROCEDURES  Procedure(s) performed:   Procedures  None   ____________________________________________   INITIAL IMPRESSION / ASSESSMENT AND PLAN / ED COURSE  Pertinent labs & imaging results that were available during my care of the patient were reviewed by me and considered in my medical decision making (see chart for details).   Patient presents emergency department with right knee pain worsening over the past several days.  Plain film of the right knee shows no acute bony abnormality.  She has no joint laxity.  The exam is not consistent with septic joint.  She is having some pain medially but also posterior in the knee.  Plan for DVT ultrasound but not available at this hour.  I have scheduled for the patient to come back at 11 AM tomorrow for ultrasound.  She follows with a vein specialist for her varicose veins.  Also provided the contact information for sports medicine should her ultrasound be negative, she may benefit from follow-up with sports medicine for rehab and other therapies.   ____________________________________________  FINAL CLINICAL IMPRESSION(S) / ED DIAGNOSES  Final diagnoses:  Right leg pain    Note:  This document was prepared using Dragon voice recognition software and may include unintentional dictation errors.  Nanda Quinton, MD, [facility] Emergency Medicine    Alianny Toelle, Wonda [facility], MD 04/21/21 [phone]

## 2021-04-22 ENCOUNTER — Ambulatory Visit (HOSPITAL_BASED_OUTPATIENT_CLINIC_OR_DEPARTMENT_OTHER)
Admission: RE | Admit: 2021-04-22 | Discharge: 2021-04-22 | Disposition: A | Discharge status: Home / Self Care | Payer: BC Managed Care – PPO | Source: Ambulatory Visit | Attending: Emergency Medicine | Admitting: Emergency Medicine

## 2021-04-22 ENCOUNTER — Ambulatory Visit (HOSPITAL_BASED_OUTPATIENT_CLINIC_OR_DEPARTMENT_OTHER): Admit: 2021-04-22 | Payer: BC Managed Care – PPO

## 2021-04-22 DIAGNOSIS — R600 Localized edema: Secondary | ICD-10-CM | POA: Insufficient documentation

## 2021-04-22 DIAGNOSIS — M79604 Pain in right leg: Secondary | ICD-10-CM | POA: Diagnosis not present

## 2021-04-22 NOTE — ED Provider Notes (Signed)
Patient returned for DVT study which is negative. Reassured patient, advised outpatient follow up with Ortho as previously planned.    Truddie Hidden, MD 04/22/21 [phone]

## 2021-05-01 ENCOUNTER — Ambulatory Visit: Payer: BC Managed Care – PPO | Admitting: Vascular Surgery

## 2021-05-01 ENCOUNTER — Other Ambulatory Visit: Payer: Self-pay

## 2021-05-01 ENCOUNTER — Encounter (HOSPITAL_COMMUNITY): Payer: No Typology Code available for payment source

## 2021-05-01 ENCOUNTER — Ambulatory Visit (HOSPITAL_COMMUNITY)
Admission: RE | Admit: 2021-05-01 | Discharge: 2021-05-01 | Disposition: A | Discharge status: Home / Self Care | Payer: BC Managed Care – PPO | Source: Ambulatory Visit | Attending: Vascular Surgery | Admitting: Vascular Surgery

## 2021-05-01 ENCOUNTER — Ambulatory Visit: Payer: No Typology Code available for payment source | Admitting: Vascular Surgery

## 2021-05-01 DIAGNOSIS — I8390 Asymptomatic varicose veins of unspecified lower extremity: Secondary | ICD-10-CM | POA: Insufficient documentation

## 2021-05-13 ENCOUNTER — Encounter: Payer: Self-pay | Admitting: Physician Assistant

## 2021-05-13 ENCOUNTER — Other Ambulatory Visit: Payer: Self-pay

## 2021-05-13 ENCOUNTER — Ambulatory Visit: Payer: BC Managed Care – PPO | Admitting: Physician Assistant

## 2021-05-13 VITALS — BP 119/76 | HR 89 | Temp 98.0°F | Ht 60.0 in | Wt 178.4 lb

## 2021-05-13 DIAGNOSIS — I872 Venous insufficiency (chronic) (peripheral): Secondary | ICD-10-CM | POA: Diagnosis not present

## 2021-05-13 DIAGNOSIS — H16223 Keratoconjunctivitis sicca, not specified as Sjogren's, bilateral: Secondary | ICD-10-CM | POA: Diagnosis not present

## 2021-05-13 NOTE — Progress Notes (Signed)
Office Note     CC:  follow up Requesting Provider:  Marda Stalker, PA-C  HPI: Brandi Castillo is a 30 y.o. (02-Dec-1990) female who presents for follow up of lower extremity swelling. She was previously seen in 2020 By Dr. Trula Slade at which time she had some painful spider veins and reticular veins. On duplex she had some reflux but her veins were too small to be candidate for ablation. He discussed lazer therapy and sclerotherapy as options for her to considered. Also recommended thigh high compression stockings if she wished to proceed.  Since she was seen last she explains that several weeks ago she injured her right knee and has had swelling present since. She had gone to the ER on 5/23 for evaluation and had DVT duplex which was negative. They recommended that she follow up with Orthopedics as well as Vascular. She says that she has scheduled follow up with orthopedics and feels that her knee injury is more related to ligaments in her knee from twisting it.    She continues to have tender legs with painful spider veins and reticular veins. She says anything touching her legs or if her kids happen to touch or sit on her lap her legs are very sore. She has burning, itching, pinching pains in the spider veins as well. Both legs bother her equally. She does not regularly elevate. She does wear knee high compression stockings about 3 days per week. She got these at [employer]. She is unsure of the compression of these. 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 She has no history of DVT. No significant family history of venous disease.   Past Medical History:  Diagnosis Date   Anxiety    Bacterial infection    H/O varicella     No past surgical history on file.  Social History   Socioeconomic History   Marital status: Single    Spouse name: Not on file   Number of children: Not on file   Years of education:  Not on file   Highest education level: Not on file  Occupational History   Not on file  Tobacco Use   Smoking status: Never   Smokeless tobacco: Never  Substance and Sexual Activity   Alcohol use: No   Drug use: No   Sexual activity: Yes    Partners: Female    Birth control/protection: None  Other Topics Concern   Not on file  Social History Narrative   Not on file   Social Determinants of Health   Financial Resource Strain: Not on file  Food Insecurity: Not on file  Transportation Needs: Not on file  Physical Activity: Not on file  Stress: Not on file  Social Connections: Not on file  Intimate Partner Violence: Not on file    Family History  Problem Relation Age of Onset   Asthma Mother    Depression Mother    Alcohol abuse Mother    Alcohol abuse Father    Asthma Sister    Asthma Brother    Diabetes Maternal Grandmother    Alcohol abuse Maternal Grandmother    Hypertension Maternal Grandfather    Alcohol abuse Maternal Grandfather    Alcohol abuse Paternal Grandmother    Diabetes Paternal Grandfather    Alcohol abuse Paternal Grandfather     Current Outpatient Medications  Medication Sig Dispense Refill   cephALEXin (KEFLEX) 500  MG capsule Take 1 capsule (500 mg total) by mouth 4 (four) times daily. (Patient not taking: No sig reported) 28 capsule 0   Elastic Bandages & Supports (MEDICAL COMPRESSION SOCKS) MISC 1 Units by Does not apply route daily. (Patient not taking: Reported on 05/13/2021) 2 each 0   ELDERBERRY PO Take 1 each by mouth daily. (Patient not taking: Reported on 05/13/2021)     megestrol (MEGACE) 40 MG tablet Take 3 tablets (120 mg total) by mouth daily. (Patient not taking: Reported on 05/13/2021) 30 tablet 0   No current facility-administered medications for this visit.    Allergies  Allergen Reactions   Sulfa Drugs Cross Reactors Rash     REVIEW OF SYSTEMS:  [X]  denotes positive finding, [ ]  denotes negative finding Cardiac  Comments:   Chest pain or chest pressure:    Shortness of breath upon exertion:    Short of breath when lying flat:    Irregular heart rhythm:        Vascular    Pain in calf, thigh, or hip brought on by ambulation:    Pain in feet at night that wakes you up from your sleep:     Blood clot in your veins:    Leg swelling:         Pulmonary    Oxygen at home:    Productive cough:     Wheezing:         Neurologic    Sudden weakness in arms or legs:     Sudden numbness in arms or legs:     Sudden onset of difficulty speaking or slurred speech:    Temporary loss of vision in one eye:     Problems with dizziness:         Gastrointestinal    Blood in stool:     Vomited blood:         Genitourinary    Burning when urinating:     Blood in urine:        Psychiatric    Major depression:         Hematologic    Bleeding problems:    Problems with blood clotting too easily:        Skin    Rashes or ulcers:        Constitutional    Fever or chills:      PHYSICAL EXAMINATION:  Vitals:   05/13/21 0845  BP: 119/76  Pulse: 89  Temp: 98 F (36.7 C)  TempSrc: Temporal  SpO2: 100%  Weight: 178 lb 6.4 oz (80.9 kg)  Height: 5' (1.524 m)    General:  WDWN in NAD; vital signs documented above Gait: Normal HENT: WNL, normocephalic Pulmonary: normal non-labored breathing  Cardiac: regular HR Abdomen: obese Extremities: 2+ Dp and PT pulses bilaterally, feet warm and well perfused. Bilateral spider veins and reticular veins on R thigh, R leg, L thigh, L leg  Musculoskeletal: no muscle wasting or atrophy  Neurologic: A&O X 3;  No focal weakness or paresthesias are detected Psychiatric:  The pt has Normal affect.   Non-Invasive Vascular Imaging:   BLE Venous Insufficiency Duplex (05/13/21):  RLE:  No DVT and SVT GSV reflux SFJ GSV diameter 0.251-0.411 No SSV reflux  CFV deep venous reflux  LLE: No DVT and SVT No GSV reflux GSV diameter 0.315-0.513 No SSV reflux  No deep  venous reflux  ASSESSMENT/PLAN:: 31 y.o. female here for follow up for bilateral lower extremity swelling and painful  spider veins.Duplex today shows no DVT or SVT bilaterally. She has no LLE incompetent veins. She does have superficial and deep reflux on the RLE. Her veins are small to be considered for ablation. She has a lot of spider and reticular veins on BLE. I discussed with her lazer therapy and/ or sclerotherapy for this. Explained that these are considered cosmetic and are not covered by insurance. She would like to think about it at this time. Encourage her to elevate, wear compression stockings, weight reduction, exercise, and refrain from prolonged standing or ambulation. She will follow up with orthopedics regarding her right knee injury. She can follow up with [place] as needed if she has new or concerning symptoms  Karoline Caldwell, PA-C [facility] [phone]  Clinic MD:   Roxanne Mins

## 2021-05-20 DIAGNOSIS — Z0000 Encounter for general adult medical examination without abnormal findings: Secondary | ICD-10-CM | POA: Diagnosis not present

## 2021-05-20 DIAGNOSIS — L680 Hirsutism: Secondary | ICD-10-CM | POA: Diagnosis not present

## 2021-05-20 DIAGNOSIS — E049 Nontoxic goiter, unspecified: Secondary | ICD-10-CM | POA: Diagnosis not present

## 2021-05-20 DIAGNOSIS — N949 Unspecified condition associated with female genital organs and menstrual cycle: Secondary | ICD-10-CM | POA: Diagnosis not present

## 2021-05-20 DIAGNOSIS — Z13220 Encounter for screening for lipoid disorders: Secondary | ICD-10-CM | POA: Diagnosis not present

## 2021-05-20 DIAGNOSIS — R635 Abnormal weight gain: Secondary | ICD-10-CM | POA: Diagnosis not present

## 2021-05-21 ENCOUNTER — Other Ambulatory Visit: Payer: Self-pay | Admitting: Family Medicine

## 2021-05-21 DIAGNOSIS — E049 Nontoxic goiter, unspecified: Secondary | ICD-10-CM

## 2021-06-09 ENCOUNTER — Ambulatory Visit
Admission: RE | Admit: 2021-06-09 | Discharge: 2021-06-09 | Disposition: A | Discharge status: Home / Self Care | Payer: BC Managed Care – PPO | Source: Ambulatory Visit | Attending: Family Medicine | Admitting: Family Medicine

## 2021-06-09 DIAGNOSIS — E010 Iodine-deficiency related diffuse (endemic) goiter: Secondary | ICD-10-CM | POA: Diagnosis not present

## 2021-06-09 DIAGNOSIS — E049 Nontoxic goiter, unspecified: Secondary | ICD-10-CM

## 2021-11-18 DIAGNOSIS — Z113 Encounter for screening for infections with a predominantly sexual mode of transmission: Secondary | ICD-10-CM | POA: Diagnosis not present

## 2022-02-12 DIAGNOSIS — R590 Localized enlarged lymph nodes: Secondary | ICD-10-CM | POA: Diagnosis not present

## 2022-03-12 ENCOUNTER — Other Ambulatory Visit (HOSPITAL_COMMUNITY)
Admission: RE | Admit: 2022-03-12 | Discharge: 2022-03-12 | Disposition: A | Discharge status: Home / Self Care | Payer: BC Managed Care – PPO | Source: Ambulatory Visit | Attending: Nurse Practitioner | Admitting: Nurse Practitioner

## 2022-03-12 ENCOUNTER — Other Ambulatory Visit: Payer: Self-pay | Admitting: Nurse Practitioner

## 2022-03-12 DIAGNOSIS — Z124 Encounter for screening for malignant neoplasm of cervix: Secondary | ICD-10-CM | POA: Diagnosis not present

## 2022-03-12 DIAGNOSIS — Z01419 Encounter for gynecological examination (general) (routine) without abnormal findings: Secondary | ICD-10-CM | POA: Diagnosis not present

## 2022-03-13 DIAGNOSIS — Z3042 Encounter for surveillance of injectable contraceptive: Secondary | ICD-10-CM | POA: Diagnosis not present

## 2022-03-13 DIAGNOSIS — Z3202 Encounter for pregnancy test, result negative: Secondary | ICD-10-CM | POA: Diagnosis not present

## 2022-03-17 LAB — CYTOLOGY - PAP
Comment: NEGATIVE
Diagnosis: UNDETERMINED — AB
High risk HPV: NEGATIVE

## 2022-05-20 DIAGNOSIS — Z30011 Encounter for initial prescription of contraceptive pills: Secondary | ICD-10-CM | POA: Diagnosis not present

## 2022-06-04 ENCOUNTER — Ambulatory Visit
Admission: EM | Admit: 2022-06-04 | Discharge: 2022-06-04 | Disposition: A | Discharge status: Home / Self Care | Payer: BC Managed Care – PPO

## 2022-06-04 DIAGNOSIS — A084 Viral intestinal infection, unspecified: Secondary | ICD-10-CM

## 2022-06-04 MED ORDER — LOPERAMIDE HCL 2 MG PO TABS: 2.0000 mg | ORAL_TABLET | Freq: Four times a day (QID) | ORAL | 0 refills | Status: DC | PRN

## 2022-06-04 MED ORDER — ONDANSETRON 4 MG PO TBDP: 4.0000 mg | ORAL_TABLET | Freq: Three times a day (TID) | ORAL | 0 refills | Status: DC | PRN

## 2022-06-04 MED ORDER — ONDANSETRON 4 MG PO TBDP
4.0000 mg | ORAL_TABLET | Freq: Once | ORAL | Status: AC
  Administered 2022-06-04: 4 mg via ORAL

## 2022-06-04 NOTE — Discharge Instructions (Signed)
I have enclosed information about gastroenteritis that I hope you find useful.  There is a prescription for Zofran and Imodium at your pharmacy, please pick those up and begin them today as prescribed.  If you have not had improvement of your symptoms in the next 5 days, please follow-up with either your primary care provider or go to the emergency room.

## 2022-06-04 NOTE — ED Triage Notes (Signed)
The Patient states this morning she has began having abs pain, n/v/d and the patient states her emesis was brown in color.   Home interventions: none

## 2022-06-04 NOTE — ED Notes (Signed)
Rounding on patient No needs at this time. Pt made aware of system downtime.

## 2022-06-04 NOTE — ED Provider Notes (Signed)
[facility]    CSN: [id number] Arrival date & time: 06/04/22  1246    HISTORY   Chief Complaint  Patient presents with   Abdominal Pain   Vomiting   Diarrhea   Chills   HPI Brandi Castillo is a 31 y.o. female. Patient presents to urgent care complaining of a 12-hour history of abdominal pain, nausea, vomiting and diarrhea.  Patient states her vomit was brown.  Patient states she has not tried any remedies for her current symptoms.  Patient has slightly elevated heart rate but otherwise normal vital signs on arrival today.  Patient denies known sick contacts.  The history is provided by the patient.   Past Medical History:  Diagnosis Date   Anxiety    Bacterial infection    H/O varicella    Patient Active Problem List   Diagnosis Date Noted   Low vitamin D level 06/24/2017   Maternal varicella, non-immune 06/24/2017   History of anxiety 03/08/2012   Family history of breast cancer 03/08/2012   History reviewed. No pertinent surgical history. OB History     Gravida  3   Para  2   Term  2   Preterm  0   AB  1   Living  2      SAB  1   IAB  0   Ectopic  0   Multiple  0   Live Births  2          Home Medications    Prior to Admission medications   Medication Sig Start Date End Date Taking? Authorizing Provider  drospirenone-ethinyl estradiol (YAZ) 3-0.02 MG tablet 1 tablet 05/20/22  Yes [provider]  cephALEXin (KEFLEX) 500 MG capsule Take 1 capsule (500 mg total) by mouth 4 (four) times daily. Patient not taking: No sig reported 04/18/20   Molpus, Jenny Reichmann, MD  Elastic Bandages & Supports (MEDICAL COMPRESSION SOCKS) MISC 1 Units by Does not apply route daily. Patient not taking: Reported on 05/13/2021 06/21/17   Kandis Cocking A, CNM  ELDERBERRY PO Take 1 each by mouth daily. Patient not taking: Reported on 05/13/2021    [provider]  medroxyPROGESTERone (DEPO-PROVERA) 150 MG/ML injection Inject 150 mg into the  muscle every 3 (three) months. 03/12/22   [provider]  megestrol (MEGACE) 40 MG tablet Take 3 tablets (120 mg total) by mouth daily. Patient not taking: Reported on 05/13/2021 07/27/20   Antonietta Breach, PA-C  omeprazole (PRILOSEC) 20 MG capsule Take 1 capsule (20 mg total) by mouth 2 (two) times daily before a meal. Patient not taking: Reported on 01/30/2019 11/10/17 04/18/20  Morene Crocker, CNM    Family History Family History  Problem Relation Age of Onset   Asthma Mother    Depression Mother    Alcohol abuse Mother    Alcohol abuse Father    Asthma Sister    Asthma Brother    Diabetes Maternal Grandmother    Alcohol abuse Maternal Grandmother    Hypertension Maternal Grandfather    Alcohol abuse Maternal Grandfather    Alcohol abuse Paternal Grandmother    Diabetes Paternal Grandfather    Alcohol abuse Paternal Grandfather    Social History Social History   Tobacco Use   Smoking status: Never   Smokeless tobacco: Never  Substance Use Topics   Alcohol use: No   Drug use: No   Allergies   Sulfa drugs cross reactors  Review of Systems Review of Systems Pertinent findings noted  in history of present illness.   Physical Exam Triage Vital Signs ED Triage Vitals  Enc Vitals Group     BP 09/26/21 0827 (!) 147/82     Pulse Rate 09/26/21 0827 72     Resp 09/26/21 0827 18     Temp 09/26/21 0827 98.3 F (36.8 C)     Temp Source 09/26/21 0827 Oral     SpO2 09/26/21 0827 98 %     Weight --      Height --      Head Circumference --      Peak Flow --      Pain Score 09/26/21 0826 5     Pain Loc --      Pain Edu? --      Excl. in [place]? --   No data found.  Updated Vital Signs BP 111/72 (BP Location: Right Arm)   Pulse (!) 101   Temp 98.5 F (36.9 C) (Oral)   Resp 18   SpO2 98%   Breastfeeding No   Physical Exam Vitals and nursing note reviewed.  Constitutional:      General: She is not in acute distress.    Appearance: Normal appearance. She is  well-developed. She is obese. She is not ill-appearing.  HENT:     Head: Normocephalic and atraumatic.  Eyes:     General: Lids are normal.        Right eye: No discharge.        Left eye: No discharge.     Extraocular Movements: Extraocular movements intact.     Conjunctiva/sclera: Conjunctivae normal.     Right eye: Right conjunctiva is not injected.     Left eye: Left conjunctiva is not injected.  Neck:     Trachea: Trachea and phonation normal.  Cardiovascular:     Rate and Rhythm: Normal rate and regular rhythm.     Pulses: Normal pulses.     Heart sounds: Normal heart sounds. No murmur heard.    No friction rub. No gallop.  Pulmonary:     Effort: Pulmonary effort is normal. No accessory muscle usage, prolonged expiration or respiratory distress.     Breath sounds: Normal breath sounds. No stridor, decreased air movement or transmitted upper airway sounds. No decreased breath sounds, wheezing, rhonchi or rales.  Chest:     Chest wall: No tenderness.  Abdominal:     General: Abdomen is flat. Bowel sounds are normal. There is no distension.     Palpations: Abdomen is soft.     Tenderness: There is generalized abdominal tenderness. There is no right CVA tenderness or left CVA tenderness.     Hernia: No hernia is present.  Musculoskeletal:        General: Normal range of motion.     Cervical back: Normal range of motion and neck supple. Normal range of motion.  Lymphadenopathy:     Cervical: No cervical adenopathy.  Skin:    General: Skin is warm and dry.     Findings: No erythema or rash.  Neurological:     General: No focal deficit present.     Mental Status: She is alert and oriented to person, place, and time.  Psychiatric:        Mood and Affect: Mood normal.        Behavior: Behavior normal.     Visual Acuity Right Eye Distance:   Left Eye Distance:   Bilateral Distance:    Right Eye Near:   Left  Eye Near:    Bilateral Near:     UC Couse / Diagnostics /  Procedures:    EKG  Radiology No results found.  Procedures Procedures (including critical care time)  UC Diagnoses / Final Clinical Impressions(s)   I have reviewed the triage vital signs and the nursing notes.  Pertinent labs & imaging results that were available during my care of the patient were reviewed by me and considered in my medical decision making (see chart for details).    Final diagnoses:  Viral gastroenteritis   Patient provided with prescriptions for Imodium and Zofran for symptoms as needed.  Return precautions advised.  ED Prescriptions     Medication Sig Dispense Auth. Provider   loperamide (IMODIUM A-D) 2 MG tablet Take 1 tablet (2 mg total) by mouth 4 (four) times daily as needed for diarrhea or loose stools. 30 tablet Lynden Oxford Scales, PA-C   ondansetron (ZOFRAN-ODT) 4 MG disintegrating tablet Take 1 tablet (4 mg total) by mouth every 8 (eight) hours as needed for nausea or vomiting. 20 tablet Lynden Oxford Scales, PA-C      PDMP not reviewed this encounter.  Pending results:  Labs Reviewed - No data to display  Medications Ordered in UC: Medications  ondansetron (ZOFRAN-ODT) disintegrating tablet 4 mg (4 mg Oral Given 06/04/22 1515)    Disposition Upon Discharge:  Condition: stable for discharge home Home: take medications as prescribed; routine discharge instructions as discussed; follow up as advised.  Patient presented with an acute illness with associated systemic symptoms and significant discomfort requiring urgent management. In my opinion, this is a condition that a prudent lay person (someone who possesses an average knowledge of health and medicine) may potentially expect to result in complications if not addressed urgently such as respiratory distress, impairment of bodily function or dysfunction of bodily organs.   Routine symptom specific, illness specific and/or disease specific instructions were discussed with the patient and/or  caregiver at length.   As such, the patient has been evaluated and assessed, work-up was performed and treatment was provided in alignment with urgent care protocols and evidence based medicine.  Patient/parent/caregiver has been advised that the patient may require follow up for further testing and treatment if the symptoms continue in spite of treatment, as clinically indicated and appropriate.  Patient/parent/caregiver has been advised to return to the [facility] or PCP if no better; to PCP or the Emergency Department if new signs and symptoms develop, or if the current signs or symptoms continue to change or worsen for further workup, evaluation and treatment as clinically indicated and appropriate  The patient will follow up with their current PCP if and as advised. If the patient does not currently have a PCP we will assist them in obtaining one.   The patient may need specialty follow up if the symptoms continue, in spite of conservative treatment and management, for further workup, evaluation, consultation and treatment as clinically indicated and appropriate.   Patient/parent/caregiver verbalized understanding and agreement of plan as discussed.  All questions were addressed during visit.  Please see discharge instructions below for further details of plan.  Discharge Instructions:   Discharge Instructions      I have enclosed information about gastroenteritis that I hope you find useful.  There is a prescription for Zofran and Imodium at your pharmacy, please pick those up and begin them today as prescribed.  If you have not had improvement of your symptoms in the next 5 days, please follow-up with  either your primary care provider or go to the emergency room.      This office note has been dictated using Museum/gallery curator.  Unfortunately, and despite my best efforts, this method of dictation can sometimes lead to occasional typographical or grammatical errors.  I  apologize in advance if this occurs.     Lynden Oxford Scales, [place] 06/06/22 1646

## 2022-07-04 DIAGNOSIS — T783XXA Angioneurotic edema, initial encounter: Secondary | ICD-10-CM | POA: Diagnosis not present

## 2022-07-04 DIAGNOSIS — N3001 Acute cystitis with hematuria: Secondary | ICD-10-CM | POA: Diagnosis not present

## 2023-01-20 ENCOUNTER — Ambulatory Visit
Admission: EM | Admit: 2023-01-20 | Discharge: 2023-01-20 | Disposition: A | Discharge status: Home / Self Care | Payer: BC Managed Care – PPO | Attending: Urgent Care | Admitting: Urgent Care

## 2023-01-20 DIAGNOSIS — B349 Viral infection, unspecified: Secondary | ICD-10-CM

## 2023-01-20 LAB — POCT INFLUENZA A/B
Influenza A, POC: NEGATIVE
Influenza B, POC: NEGATIVE

## 2023-01-20 MED ORDER — PSEUDOEPHEDRINE HCL 60 MG PO TABS: 60.0000 mg | ORAL_TABLET | Freq: Three times a day (TID) | ORAL | 0 refills | Status: AC | PRN

## 2023-01-20 MED ORDER — PROMETHAZINE-DM 6.25-15 MG/5ML PO SYRP: 5.0000 mL | ORAL_SOLUTION | Freq: Three times a day (TID) | ORAL | 0 refills | Status: AC | PRN

## 2023-01-20 MED ORDER — ONDANSETRON 8 MG PO TBDP: 8.0000 mg | ORAL_TABLET | Freq: Three times a day (TID) | ORAL | 0 refills | Status: AC | PRN

## 2023-01-20 MED ORDER — CETIRIZINE HCL 10 MG PO TABS: 10.0000 mg | ORAL_TABLET | Freq: Every day | ORAL | 0 refills | Status: AC

## 2023-01-20 NOTE — ED Provider Notes (Addendum)
[facility]  Note:  This document was prepared using Systems analyst and may include unintentional dictation errors.  MRN: [id number] DOB: [birth year]  Subjective:   Brandi Castillo is a 32 y.o. female presenting for 1 day history of fever, sinus pressure, upset stomach, vomiting and diarrhea, coughing, body aches.  Had COVID-19 about 2 months ago.  Tested for influenza yesterday and was negative.  No chest pain, shortness of breath or wheezing.  No history of asthma.  Has exposure to secondhand smoke at home.  No current facility-administered medications for this encounter.  Current Outpatient Medications:    drospirenone-ethinyl estradiol (YAZ) 3-0.02 MG tablet, 1 tablet, Disp: , Rfl:    medroxyPROGESTERone (DEPO-PROVERA) 150 MG/ML injection, Inject 150 mg into the muscle every 3 (three) months., Disp: , Rfl:    Allergies  Allergen Reactions   Sulfa Drugs Cross Reactors Rash    Past Medical History:  Diagnosis Date   Anxiety    Bacterial infection    H/O varicella      History reviewed. No pertinent surgical history.  Family History  Problem Relation Age of Onset   Asthma Mother    Depression Mother    Alcohol abuse Mother    Alcohol abuse Father    Asthma Sister    Asthma Brother    Diabetes Maternal Grandmother    Alcohol abuse Maternal Grandmother    Hypertension Maternal Grandfather    Alcohol abuse Maternal Grandfather    Alcohol abuse Paternal Grandmother    Diabetes Paternal Grandfather    Alcohol abuse Paternal Grandfather     Social History   Tobacco Use   Smoking status: Never   Smokeless tobacco: Never  Vaping Use   Vaping Use: Never used  Substance Use Topics   Alcohol use: No   Drug use: No    ROS   Objective:   Vitals: BP 122/85 (BP Location: Right Arm)   Pulse 95   Temp 100.1 F (37.8 C) (Oral)   Resp 18   LMP 12/31/2022   SpO2 100%   Physical Exam Constitutional:      General:  She is not in acute distress.    Appearance: Normal appearance. She is well-developed and normal weight. She is not ill-appearing, toxic-appearing or diaphoretic.  HENT:     Head: Normocephalic and atraumatic.     Right Ear: Tympanic membrane, ear canal and external ear normal. No drainage or tenderness. No middle ear effusion. There is no impacted cerumen. Tympanic membrane is not erythematous or bulging.     Left Ear: Tympanic membrane, ear canal and external ear normal. No drainage or tenderness.  No middle ear effusion. There is no impacted cerumen. Tympanic membrane is not erythematous or bulging.     Nose: Nose normal. No congestion or rhinorrhea.     Mouth/Throat:     Mouth: Mucous membranes are moist. No oral lesions.     Pharynx: No pharyngeal swelling, oropharyngeal exudate, posterior oropharyngeal erythema or uvula swelling.     Tonsils: No tonsillar exudate or tonsillar abscesses.  Eyes:     General: No scleral icterus.       Right eye: No discharge.        Left eye: No discharge.     Extraocular Movements: Extraocular movements intact.     Right eye: Normal extraocular motion.     Left eye: Normal extraocular motion.     Conjunctiva/sclera: Conjunctivae normal.  Cardiovascular:     Rate  and Rhythm: Normal rate and regular rhythm.     Heart sounds: Normal heart sounds. No murmur heard.    No friction rub. No gallop.  Pulmonary:     Effort: Pulmonary effort is normal. No respiratory distress.     Breath sounds: No stridor. No wheezing, rhonchi or rales.  Chest:     Chest wall: No tenderness.  Abdominal:     General: Bowel sounds are normal. There is no distension.     Palpations: Abdomen is soft. There is no mass.     Tenderness: There is no abdominal tenderness. There is no right CVA tenderness, left CVA tenderness, guarding or rebound.  Musculoskeletal:     Cervical back: Normal range of motion and neck supple.  Lymphadenopathy:     Cervical: No cervical adenopathy.   Skin:    General: Skin is warm and dry.  Neurological:     General: No focal deficit present.     Mental Status: She is alert and oriented to person, place, and time.  Psychiatric:        Mood and Affect: Mood normal.        Behavior: Behavior normal.        Thought Content: Thought content normal.        Judgment: Judgment normal.    Assessment and Plan :   PDMP not reviewed this encounter.  1. Acute viral syndrome     Point-of-care influenza test pending at time of discharge.  Will call with results.  Deferred COVID testing as she already had this 2 months ago.  Deferred imaging given clear cardiopulmonary exam, hemodynamically stable vital signs. Does not meet [facility] criteria for strep testing.  Suspect viral URI, viral syndrome. Physical exam findings reassuring and vital signs stable for discharge. Advised supportive care, offered symptomatic relief. Counseled patient on potential for adverse effects with medications prescribed/recommended today, ER and return-to-clinic precautions discussed, patient verbalized understanding.     Jaynee Eagles, Hershal Coria 01/20/23 [id number]  Results for orders placed or performed during the hospital encounter of 01/20/23 (from the past 24 hour(s))  POCT Influenza A/B     Status: None   Collection Time: 01/20/23  9:30 AM  Result Value Ref Range   Influenza A, POC Negative Negative   Influenza B, POC Negative Negative      Jaynee Eagles, [place] 01/20/23 [id number]

## 2023-01-20 NOTE — ED Triage Notes (Signed)
Pt c/o cough,fever, sinus pressure,v/d started yesterday-NAD-steady gait

## 2023-01-20 NOTE — Discharge Instructions (Signed)
We will manage this as a viral illness. For sore throat or cough try using a honey-based tea. Use 3 teaspoons of honey with juice squeezed from half lemon. Place shaved pieces of ginger into 1/2-1 cup of water and warm over stove top. Then mix the ingredients and repeat every 4 hours as needed. Please take ibuprofen 653m every 6 hours with food alternating with OR taken together with Tylenol 5021m650mg every 6 hours for throat pain, fevers, aches and pains. Hydrate very well with at least 2 liters of water. Eat light meals such as soups (chicken and noodles, vegetable, chicken and wild rice).  Do not eat foods that you are allergic to.  Taking an antihistamine like Zyrtec (1046maily) can help against postnasal drainage, sinus congestion which can cause sinus pain, sinus headaches, throat pain, painful swallowing, coughing.  You can take this together with pseudoephedrine (Sudafed) at a dose of 60 mg 3 times a day or twice daily as needed for the same kind of nasal drip, congestion.  Use cough medications.

## 2023-01-21 ENCOUNTER — Emergency Department (HOSPITAL_BASED_OUTPATIENT_CLINIC_OR_DEPARTMENT_OTHER)
Admission: EM | Admit: 2023-01-21 | Discharge: 2023-01-21 | Disposition: A | Discharge status: Home / Self Care | Payer: BC Managed Care – PPO | Attending: Emergency Medicine | Admitting: Emergency Medicine

## 2023-01-21 ENCOUNTER — Other Ambulatory Visit: Payer: Self-pay

## 2023-01-21 DIAGNOSIS — Z1152 Encounter for screening for COVID-19: Secondary | ICD-10-CM | POA: Insufficient documentation

## 2023-01-21 DIAGNOSIS — R109 Unspecified abdominal pain: Secondary | ICD-10-CM | POA: Diagnosis not present

## 2023-01-21 DIAGNOSIS — J101 Influenza due to other identified influenza virus with other respiratory manifestations: Secondary | ICD-10-CM | POA: Insufficient documentation

## 2023-01-21 DIAGNOSIS — R000 Tachycardia, unspecified: Secondary | ICD-10-CM | POA: Diagnosis not present

## 2023-01-21 DIAGNOSIS — R509 Fever, unspecified: Secondary | ICD-10-CM | POA: Diagnosis not present

## 2023-01-21 LAB — COMPREHENSIVE METABOLIC PANEL
ALT: 14 U/L (ref 0–44)
AST: 16 U/L (ref 15–41)
Albumin: 4.1 g/dL (ref 3.5–5.0)
Alkaline Phosphatase: 46 U/L (ref 38–126)
Anion gap: 9 (ref 5–15)
BUN: 13 mg/dL (ref 6–20)
CO2: 27 mmol/L (ref 22–32)
Calcium: 9.4 mg/dL (ref 8.9–10.3)
Chloride: 101 mmol/L (ref 98–111)
Creatinine, Ser: 1.09 mg/dL — ABNORMAL HIGH (ref 0.44–1.00)
GFR, Estimated: >60 mL/min (ref >60)
Glucose, Bld: 93 mg/dL (ref 70–99)
Potassium: 3.8 mmol/L (ref 3.5–5.1)
Sodium: 137 mmol/L (ref 135–145)
Total Bilirubin: 0.2 mg/dL — ABNORMAL LOW (ref 0.3–1.2)
Total Protein: 7.5 g/dL (ref 6.5–8.1)

## 2023-01-21 LAB — CBC WITH DIFFERENTIAL/PLATELET
Abs Immature Granulocytes: 0.02 10*3/uL (ref 0.00–0.07)
Basophils Absolute: 0 10*3/uL (ref 0.0–0.1)
Basophils Relative: 0 %
Eosinophils Absolute: 0 10*3/uL (ref 0.0–0.5)
Eosinophils Relative: 0 %
HCT: 40 % (ref 36.0–46.0)
Hemoglobin: 13.3 g/dL (ref 12.0–15.0)
Immature Granulocytes: 0 %
Lymphocytes Relative: 19 %
Lymphs Abs: 1.8 10*3/uL (ref 0.7–4.0)
MCH: 29.3 pg (ref 26.0–34.0)
MCHC: 33.3 g/dL (ref 30.0–36.0)
MCV: 88.1 fL (ref 80.0–100.0)
Monocytes Absolute: 1 10*3/uL (ref 0.1–1.0)
Monocytes Relative: 10 %
Neutro Abs: 6.6 10*3/uL (ref 1.7–7.7)
Neutrophils Relative %: 71 %
Platelets: 215 10*3/uL (ref 150–400)
RBC: 4.54 MIL/uL (ref 3.87–5.11)
RDW: 13.4 % (ref 11.5–15.5)
WBC: 9.4 10*3/uL (ref 4.0–10.5)
nRBC: 0 % (ref 0.0–0.2)

## 2023-01-21 LAB — URINALYSIS, ROUTINE W REFLEX MICROSCOPIC
Bilirubin Urine: NEGATIVE
Glucose, UA: NEGATIVE mg/dL
Ketones, ur: NEGATIVE mg/dL
Leukocytes,Ua: NEGATIVE
Nitrite: NEGATIVE
Protein, ur: NEGATIVE mg/dL
Specific Gravity, Urine: 1.016 (ref 1.005–1.030)
pH: 5.5 (ref 5.0–8.0)

## 2023-01-21 LAB — RESP PANEL BY RT-PCR (RSV, FLU A&B, COVID)  RVPGX2
Influenza A by PCR: NEGATIVE
Influenza B by PCR: POSITIVE — AB
Resp Syncytial Virus by PCR: NEGATIVE
SARS Coronavirus 2 by RT PCR: NEGATIVE

## 2023-01-21 LAB — GROUP A STREP BY PCR: Group A Strep by PCR: NOT DETECTED

## 2023-01-21 LAB — LIPASE, BLOOD: Lipase: 19 U/L (ref 11–51)

## 2023-01-21 MED ORDER — DICYCLOMINE HCL 10 MG/ML IM SOLN
20.0000 mg | Freq: Once | INTRAMUSCULAR | Status: AC
  Administered 2023-01-21: 20 mg via INTRAMUSCULAR
  Filled 2023-01-21: qty 2

## 2023-01-21 NOTE — Discharge Instructions (Addendum)
You were diagnosed today with the flu.  Please take over-the-counter medication such as ibuprofen and acetaminophen for symptom control at home.  You may also take the nausea medication as previously prescribed.  Please return to the emergency department if you have any life-threatening symptoms.

## 2023-01-21 NOTE — ED Notes (Signed)
IV removed from pt's right AC. IV site clean, dry, and intact. IV removed, per Apolonio Schneiders - RN.

## 2023-01-21 NOTE — ED Provider Notes (Signed)
[facility] Provider Note   CSN: [id number] Arrival date & time: 01/21/23  1029     History  Chief Complaint  Patient presents with   Abdominal Pain    Brandi Castillo is a 32 y.o. female.  Patient presents to the emergency department complaining of 3 days of abdominal pain, subjective fevers, nausea, diarrhea.  Patient was evaluated yesterday at urgent care where they performed flu testing which was negative.  She states they prescribed her nausea medicine but this was not her chief complaint.  She states her chief complaint is abdominal cramping which is continued overnight and worsened into today.  She denies urinary symptoms, vaginal discharge, vaginal bleeding, shortness of breath, chest pain.  Endorses abdominal pain, nausea, diarrhea, subjective fever.  Past medical history significant for anxiety  HPI     Home Medications Prior to Admission medications   Medication Sig Start Date End Date Taking? Authorizing Provider  cetirizine (ZYRTEC ALLERGY) 10 MG tablet Take 1 tablet (10 mg total) by mouth daily. 01/20/23   Jaynee Eagles, PA-C  drospirenone-ethinyl estradiol (YAZ) 3-0.02 MG tablet 1 tablet 05/20/22   [provider]  medroxyPROGESTERone (DEPO-PROVERA) 150 MG/ML injection Inject 150 mg into the muscle every 3 (three) months. 03/12/22   [provider]  ondansetron (ZOFRAN-ODT) 8 MG disintegrating tablet Take 1 tablet (8 mg total) by mouth every 8 (eight) hours as needed for nausea or vomiting. 01/20/23   Jaynee Eagles, PA-C  promethazine-dextromethorphan (PROMETHAZINE-DM) 6.25-15 MG/5ML syrup Take 5 mLs by mouth 3 (three) times daily as needed for cough. 01/20/23   Jaynee Eagles, PA-C  pseudoephedrine (SUDAFED) 60 MG tablet Take 1 tablet (60 mg total) by mouth every 8 (eight) hours as needed for congestion. 01/20/23   Jaynee Eagles, PA-C  omeprazole (PRILOSEC) 20 MG capsule Take 1 capsule (20 mg total) by mouth 2 (two)  times daily before a meal. Patient not taking: Reported on 01/30/2019 11/10/17 04/18/20  Kandis Cocking A, CNM      Allergies    Sulfa drugs cross reactors    Review of Systems   Review of Systems  Constitutional:  Positive for fever.  Respiratory:  Negative for shortness of breath.   Cardiovascular:  Negative for chest pain.  Gastrointestinal:  Positive for abdominal pain, diarrhea and nausea. Negative for constipation and vomiting.  Genitourinary:  Negative for dysuria, vaginal bleeding and vaginal discharge.    Physical Exam Updated Vital Signs BP (!) 132/93 (BP Location: Right Arm)   Pulse (!) 110   Temp 99.3 F (37.4 C) (Oral)   LMP 12/31/2022 (Exact Date)   SpO2 98%  Physical Exam Vitals and nursing note reviewed.  Constitutional:      General: She is not in acute distress.    Appearance: She is well-developed.  HENT:     Head: Normocephalic and atraumatic.  Eyes:     Conjunctiva/sclera: Conjunctivae normal.  Cardiovascular:     Rate and Rhythm: Regular rhythm. Tachycardia present.     Heart sounds: No murmur heard. Pulmonary:     Effort: Pulmonary effort is normal. No respiratory distress.     Breath sounds: Normal breath sounds.  Abdominal:     Palpations: Abdomen is soft.     Tenderness: There is no abdominal tenderness.  Musculoskeletal:        General: No swelling.     Cervical back: Neck supple.  Skin:    General: Skin is warm and dry.     Capillary  Refill: Capillary refill takes less than 2 seconds.  Neurological:     Mental Status: She is alert.  Psychiatric:        Mood and Affect: Mood normal.     ED Results / Procedures / Treatments   Labs (all labs ordered are listed, but only abnormal results are displayed) Labs Reviewed  RESP PANEL BY RT-PCR (RSV, FLU A&B, COVID)  RVPGX2 - Abnormal; Notable for the following components:      Result Value   Influenza B by PCR POSITIVE (*)    All other components within normal limits  COMPREHENSIVE  METABOLIC PANEL - Abnormal; Notable for the following components:   Creatinine, Ser 1.09 (*)    Total Bilirubin 0.2 (*)    All other components within normal limits  URINALYSIS, ROUTINE W REFLEX MICROSCOPIC - Abnormal; Notable for the following components:   Hgb urine dipstick SMALL (*)    Bacteria, UA RARE (*)    All other components within normal limits  GROUP A STREP BY PCR  CBC WITH DIFFERENTIAL/PLATELET  LIPASE, BLOOD    EKG None  Radiology No results found.  Procedures Procedures    Medications Ordered in ED Medications  dicyclomine (BENTYL) injection 20 mg (20 mg Intramuscular Given 01/21/23 1053)    ED Course/ Medical Decision Making/ A&P                             Medical Decision Making Amount and/or Complexity of Data Reviewed Labs: ordered.  Risk Prescription drug management.   This patient presents to the ED for concern of abdominal pain, this involves an extensive number of treatment options, and is a complaint that carries with it a high risk of complications and morbidity.  The differential diagnosis includes gastroenteritis, COVID-19, strep throat, appendicitis, cholecystitis, colitis, others   Co morbidities that complicate the patient evaluation  History of anxiety   Additional history obtained:   External records from outside source obtained and reviewed including urgent care note from yesterday   Lab Tests:  I Ordered, and personally interpreted labs.  The pertinent results include: Creatinine 1.09 with unknown baseline, positive influenza B   Problem List / ED Course / Critical interventions / Medication management   I ordered medication including Bentyl for abdominal pain Reevaluation of the patient after these medicines showed that the patient improved I have reviewed the patients home medicines and have made adjustments as needed   Test / Admission - Considered:  Patient tested positive for influenza B which is consistent  with her history.  Plan to discharge patient home with recommendations for supportive care at home.  She is outside of the window at this time for antiviral therapy.        Final Clinical Impression(s) / ED Diagnoses Final diagnoses:  Influenza B    Rx / DC Orders ED Discharge Orders     None         Ronny Bacon 01/21/23 [address], [place], [place] 01/22/23 [phone]

## 2023-01-21 NOTE — ED Triage Notes (Signed)
Pt reports stomach cramping along with pain, nausea and fever yesterday. Seen in UC.
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
## 2023-06-01 DIAGNOSIS — N898 Other specified noninflammatory disorders of vagina: Secondary | ICD-10-CM | POA: Diagnosis not present

## 2023-06-01 DIAGNOSIS — Z3041 Encounter for surveillance of contraceptive pills: Secondary | ICD-10-CM | POA: Diagnosis not present

## 2023-06-01 DIAGNOSIS — Z124 Encounter for screening for malignant neoplasm of cervix: Secondary | ICD-10-CM | POA: Diagnosis not present

## 2023-06-01 DIAGNOSIS — Z01419 Encounter for gynecological examination (general) (routine) without abnormal findings: Secondary | ICD-10-CM | POA: Diagnosis not present

## 2023-08-31 DIAGNOSIS — F411 Generalized anxiety disorder: Secondary | ICD-10-CM | POA: Diagnosis not present

## 2023-09-08 DIAGNOSIS — F411 Generalized anxiety disorder: Secondary | ICD-10-CM | POA: Diagnosis not present

## 2023-09-15 DIAGNOSIS — F411 Generalized anxiety disorder: Secondary | ICD-10-CM | POA: Diagnosis not present

## 2023-09-22 DIAGNOSIS — F411 Generalized anxiety disorder: Secondary | ICD-10-CM | POA: Diagnosis not present

## 2023-09-29 DIAGNOSIS — F411 Generalized anxiety disorder: Secondary | ICD-10-CM | POA: Diagnosis not present

## 2023-10-08 DIAGNOSIS — F411 Generalized anxiety disorder: Secondary | ICD-10-CM | POA: Diagnosis not present

## 2023-10-12 DIAGNOSIS — F411 Generalized anxiety disorder: Secondary | ICD-10-CM | POA: Diagnosis not present

## 2023-10-20 DIAGNOSIS — R1084 Generalized abdominal pain: Secondary | ICD-10-CM | POA: Diagnosis not present

## 2023-10-20 DIAGNOSIS — R519 Headache, unspecified: Secondary | ICD-10-CM | POA: Diagnosis not present

## 2023-10-20 DIAGNOSIS — E049 Nontoxic goiter, unspecified: Secondary | ICD-10-CM | POA: Diagnosis not present

## 2023-10-21 ENCOUNTER — Other Ambulatory Visit: Payer: Self-pay | Admitting: Family Medicine

## 2023-10-21 DIAGNOSIS — R1084 Generalized abdominal pain: Secondary | ICD-10-CM

## 2023-10-22 DIAGNOSIS — F411 Generalized anxiety disorder: Secondary | ICD-10-CM | POA: Diagnosis not present

## 2023-10-27 ENCOUNTER — Ambulatory Visit
Admission: RE | Admit: 2023-10-27 | Discharge: 2023-10-27 | Disposition: A | Discharge status: Home / Self Care | Payer: BC Managed Care – PPO | Source: Ambulatory Visit | Attending: Family Medicine | Admitting: Family Medicine

## 2023-10-27 DIAGNOSIS — R1084 Generalized abdominal pain: Secondary | ICD-10-CM

## 2023-11-03 DIAGNOSIS — F411 Generalized anxiety disorder: Secondary | ICD-10-CM | POA: Diagnosis not present

## 2023-11-26 DIAGNOSIS — F411 Generalized anxiety disorder: Secondary | ICD-10-CM | POA: Diagnosis not present

## 2023-12-01 DIAGNOSIS — F411 Generalized anxiety disorder: Secondary | ICD-10-CM | POA: Diagnosis not present

## 2024-02-01 ENCOUNTER — Ambulatory Visit
Admission: EM | Admit: 2024-02-01 | Discharge: 2024-02-01 | Disposition: A | Discharge status: Home / Self Care | Attending: Family Medicine | Admitting: Family Medicine

## 2024-02-01 DIAGNOSIS — S0081XA Abrasion of other part of head, initial encounter: Secondary | ICD-10-CM

## 2024-02-01 MED ORDER — MUPIROCIN 2 % EX OINT: 1.0000 | TOPICAL_OINTMENT | Freq: Two times a day (BID) | CUTANEOUS | 0 refills | Status: AC
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
## 2024-02-01 NOTE — Discharge Instructions (Addendum)
 Keep the wound clean and dry.  Apply mupirocin topical antibiotic ointment twice daily for 5 days.  Monitor for any signs of infection which include but are not limited to swelling, redness, warmth, drainage, fevers or chills and please seek reevaluation if these occur.  Please follow-up with your PCP if your symptoms do not improve.  Hope you feel better soon!
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
## 2024-02-23 ENCOUNTER — Emergency Department (HOSPITAL_BASED_OUTPATIENT_CLINIC_OR_DEPARTMENT_OTHER)
Admission: EM | Admit: 2024-02-23 | Discharge: 2024-02-23 | Disposition: A | Discharge status: Home / Self Care | Attending: Emergency Medicine | Admitting: Emergency Medicine

## 2024-02-23 ENCOUNTER — Emergency Department (HOSPITAL_BASED_OUTPATIENT_CLINIC_OR_DEPARTMENT_OTHER)

## 2024-02-23 ENCOUNTER — Other Ambulatory Visit: Payer: Self-pay

## 2024-02-23 ENCOUNTER — Encounter (HOSPITAL_BASED_OUTPATIENT_CLINIC_OR_DEPARTMENT_OTHER): Payer: Self-pay | Admitting: Emergency Medicine

## 2024-02-23 DIAGNOSIS — R6883 Chills (without fever): Secondary | ICD-10-CM | POA: Insufficient documentation

## 2024-02-23 DIAGNOSIS — R059 Cough, unspecified: Secondary | ICD-10-CM | POA: Insufficient documentation

## 2024-02-23 DIAGNOSIS — R630 Anorexia: Secondary | ICD-10-CM | POA: Diagnosis not present

## 2024-02-23 DIAGNOSIS — R000 Tachycardia, unspecified: Secondary | ICD-10-CM | POA: Insufficient documentation

## 2024-02-23 DIAGNOSIS — R1084 Generalized abdominal pain: Secondary | ICD-10-CM | POA: Insufficient documentation

## 2024-02-23 DIAGNOSIS — R112 Nausea with vomiting, unspecified: Secondary | ICD-10-CM | POA: Diagnosis not present

## 2024-02-23 DIAGNOSIS — R197 Diarrhea, unspecified: Secondary | ICD-10-CM | POA: Diagnosis not present

## 2024-02-23 LAB — COMPREHENSIVE METABOLIC PANEL
ALT: 10 U/L (ref 0–44)
AST: 14 U/L — ABNORMAL LOW (ref 15–41)
Albumin: 3.5 g/dL (ref 3.5–5.0)
Alkaline Phosphatase: 53 U/L (ref 38–126)
Anion gap: 8 (ref 5–15)
BUN: 12 mg/dL (ref 6–20)
CO2: 24 mmol/L (ref 22–32)
Calcium: 8.9 mg/dL (ref 8.9–10.3)
Chloride: 105 mmol/L (ref 98–111)
Creatinine, Ser: 0.84 mg/dL (ref 0.44–1.00)
GFR, Estimated: >60 mL/min (ref >60)
Glucose, Bld: 99 mg/dL (ref 70–99)
Potassium: 4 mmol/L (ref 3.5–5.1)
Sodium: 137 mmol/L (ref 135–145)
Total Bilirubin: 0.7 mg/dL (ref 0.0–1.2)
Total Protein: 7.6 g/dL (ref 6.5–8.1)

## 2024-02-23 LAB — CBC
HCT: 40.6 % (ref 36.0–46.0)
Hemoglobin: 13.9 g/dL (ref 12.0–15.0)
MCH: 29.7 pg (ref 26.0–34.0)
MCHC: 34.2 g/dL (ref 30.0–36.0)
MCV: 86.8 fL (ref 80.0–100.0)
Platelets: 240 10*3/uL (ref 150–400)
RBC: 4.68 MIL/uL (ref 3.87–5.11)
RDW: 12.6 % (ref 11.5–15.5)
WBC: 8.3 10*3/uL (ref 4.0–10.5)
nRBC: 0 % (ref 0.0–0.2)

## 2024-02-23 LAB — URINALYSIS, ROUTINE W REFLEX MICROSCOPIC
Bilirubin Urine: NEGATIVE
Glucose, UA: NEGATIVE mg/dL
Ketones, ur: 40 mg/dL — AB
Leukocytes,Ua: NEGATIVE
Nitrite: NEGATIVE
Protein, ur: 30 mg/dL — AB
Specific Gravity, Urine: >=1.03 (ref 1.005–1.030)
pH: 5.5 (ref 5.0–8.0)

## 2024-02-23 LAB — RESP PANEL BY RT-PCR (RSV, FLU A&B, COVID)  RVPGX2
Influenza A by PCR: NEGATIVE
Influenza B by PCR: NEGATIVE
Resp Syncytial Virus by PCR: NEGATIVE
SARS Coronavirus 2 by RT PCR: NEGATIVE

## 2024-02-23 LAB — URINALYSIS, MICROSCOPIC (REFLEX)

## 2024-02-23 LAB — PREGNANCY, URINE: Preg Test, Ur: NEGATIVE

## 2024-02-23 LAB — LIPASE, BLOOD: Lipase: 23 U/L (ref 11–51)

## 2024-02-23 MED ORDER — MORPHINE SULFATE (PF) 4 MG/ML IV SOLN
4.0000 mg | Freq: Once | INTRAVENOUS | Status: AC
  Administered 2024-02-23: 4 mg via INTRAVENOUS
  Filled 2024-02-23: qty 1

## 2024-02-23 MED ORDER — LACTATED RINGERS IV BOLUS
1000.0000 mL | Freq: Once | INTRAVENOUS | Status: AC
  Administered 2024-02-23: 1000 mL via INTRAVENOUS

## 2024-02-23 MED ORDER — ONDANSETRON 4 MG PO TBDP: 4.0000 mg | ORAL_TABLET | Freq: Three times a day (TID) | ORAL | 0 refills | Status: AC | PRN

## 2024-02-23 MED ORDER — ONDANSETRON HCL 4 MG/2ML IJ SOLN
4.0000 mg | Freq: Once | INTRAMUSCULAR | Status: AC
  Administered 2024-02-23: 4 mg via INTRAVENOUS
  Filled 2024-02-23: qty 2

## 2024-02-23 MED ORDER — ONDANSETRON 4 MG PO TBDP: 4.0000 mg | ORAL_TABLET | Freq: Three times a day (TID) | ORAL | 0 refills | Status: DC | PRN

## 2024-02-23 MED ORDER — IOHEXOL 300 MG/ML  SOLN
100.0000 mL | Freq: Once | INTRAMUSCULAR | Status: AC | PRN
  Administered 2024-02-23: 100 mL via INTRAVENOUS

## 2024-02-23 NOTE — Discharge Instructions (Addendum)
 Thank you for let us evaluate you today.  Your lab work was all normal.  You do not have a urinary tract infection, you do not have elevated white blood cell count to indicate infection or inflammation.  There are no electrolyte abnormalities.  You are not pregnant.  Your CT scan did not show any acute abnormalities for etiology of pain.  This is likely secondary to a virus/gastroenteritis.  Please make sure to remain adequately hydrated.  If you do not eat that is okay as long as you drink water, Gatorade, Pedialyte, liquid IV, chicken broth.  Return to emergency department if you have severe dehydration secondary to intractable vomiting and are unable to keep fluids down, have blood in stool

## 2024-02-23 NOTE — ED Provider Notes (Signed)
 [facility] Provider Note   CSN: [id number] Arrival date & time: 02/23/24  1254     History  Chief Complaint  Patient presents with   Abdominal Pain    Brandi Castillo is a 33 y.o. female with past medical history of anxiety presents to emergency department for evaluation of nonproductive cough, congestion, chills, nausea, vomiting, diarrhea, diffuse abdominal pain that started last night at midnight.  She vomited 5 times and had 7 episodes of diarrhea since then.  Recent changes in her diet habits include a new tea that she found on TikTok for decreasing appetite that she started yesterday.  She ate cookout yesterday but did not finish it due to decreased appetite.  Endorses some "gassiness" that started yesterday.  Has taken ibuprofen and Zofran without much relief. Female partner at bedside whom she resides with did not eat cookout nor has similar symptoms.   Abdominal Pain Associated symptoms: no chest pain, no chills, no constipation, no cough, no diarrhea, no fatigue, no fever, no nausea, no shortness of breath and no vomiting        Home Medications Prior to Admission medications   Medication Sig Start Date End Date Taking? Authorizing Provider  cetirizine (ZYRTEC ALLERGY) 10 MG tablet Take 1 tablet (10 mg total) by mouth daily. 01/20/23   Wallis Bamberg, PA-C  drospirenone-ethinyl estradiol (YAZ) 3-0.02 MG tablet 1 tablet 05/20/22   [provider]  medroxyPROGESTERone (DEPO-PROVERA) 150 MG/ML injection Inject 150 mg into the muscle every 3 (three) months. 03/12/22   [provider]  ondansetron (ZOFRAN-ODT) 4 MG disintegrating tablet Take 1 tablet (4 mg total) by mouth every 8 (eight) hours as needed for nausea or vomiting. 02/23/24   Judithann Sheen, PA  ondansetron (ZOFRAN-ODT) 8 MG disintegrating tablet Take 1 tablet (8 mg total) by mouth every 8 (eight) hours as needed for nausea or vomiting. 01/20/23   Wallis Bamberg, PA-C  promethazine-dextromethorphan (PROMETHAZINE-DM) 6.25-15 MG/5ML syrup Take 5 mLs by mouth 3 (three) times daily as needed for cough. 01/20/23   Wallis Bamberg, PA-C  pseudoephedrine (SUDAFED) 60 MG tablet Take 1 tablet (60 mg total) by mouth every 8 (eight) hours as needed for congestion. 01/20/23   Wallis Bamberg, PA-C  omeprazole (PRILOSEC) 20 MG capsule Take 1 capsule (20 mg total) by mouth 2 (two) times daily before a meal. Patient not taking: Reported on 01/30/2019 11/10/17 04/18/20  Orvilla Cornwall A, CNM      Allergies    Sulfa drugs cross reactors    Review of Systems   Review of Systems  Constitutional:  Negative for chills, fatigue and fever.  Respiratory:  Negative for cough, chest tightness, shortness of breath and wheezing.   Cardiovascular:  Negative for chest pain and palpitations.  Gastrointestinal:  Positive for abdominal pain. Negative for constipation, diarrhea, nausea and vomiting.  Neurological:  Negative for dizziness, seizures, weakness, light-headedness, numbness and headaches.    Physical Exam Updated Vital Signs BP 118/80 (BP Location: Right Arm)   Pulse (!) 113   Temp 99.6 F (37.6 C) (Oral)   Resp 16   Ht 5' (1.524 m)   Wt 79.4 kg   LMP 02/08/2024 (Exact Date)   SpO2 100%   BMI 34.18 kg/m  Physical Exam Vitals and nursing note reviewed.  Constitutional:      General: She is not in acute distress.    Appearance: Normal appearance. She is not ill-appearing.  HENT:     Head:  Normocephalic and atraumatic.  Eyes:     Conjunctiva/sclera: Conjunctivae normal.  Cardiovascular:     Rate and Rhythm: Tachycardia present.  Pulmonary:     Effort: Pulmonary effort is normal. No respiratory distress.     Breath sounds: Normal breath sounds.  Chest:     Chest wall: No tenderness.  Abdominal:     General: Bowel sounds are normal. There is no distension.     Palpations: Abdomen is soft.     Tenderness: There is generalized abdominal tenderness. There is  no right CVA tenderness, left CVA tenderness, guarding or rebound.  Musculoskeletal:     Right lower leg: No edema.     Left lower leg: No edema.  Skin:    Capillary Refill: Capillary refill takes less than 2 seconds.     Coloration: Skin is not jaundiced or pale.  Neurological:     Mental Status: She is alert and oriented to person, place, and time. Mental status is at baseline.     ED Results / Procedures / Treatments   Labs (all labs ordered are listed, but only abnormal results are displayed) Labs Reviewed  COMPREHENSIVE METABOLIC PANEL - Abnormal; Notable for the following components:      Result Value   AST 14 (*)    All other components within normal limits  URINALYSIS, ROUTINE W REFLEX MICROSCOPIC - Abnormal; Notable for the following components:   Hgb urine dipstick SMALL (*)    Ketones, ur 40 (*)    Protein, ur 30 (*)    All other components within normal limits  URINALYSIS, MICROSCOPIC (REFLEX) - Abnormal; Notable for the following components:   Bacteria, UA RARE (*)    All other components within normal limits  RESP PANEL BY RT-PCR (RSV, FLU A&B, COVID)  RVPGX2  LIPASE, BLOOD  CBC  PREGNANCY, URINE    EKG None  Radiology CT ABDOMEN PELVIS W CONTRAST Result Date: 02/23/2024 CLINICAL DATA:  RLQ abdominal pain. NVD since last night, feels dehydrated; intermittent sharp, shooting pains all over abd No surgery. EXAM: CT ABDOMEN AND PELVIS WITH CONTRAST TECHNIQUE: Multidetector CT imaging of the abdomen and pelvis was performed using the standard protocol following bolus administration of intravenous contrast. RADIATION DOSE REDUCTION: This exam was performed according to the departmental dose-optimization program which includes automated exposure control, adjustment of the mA and/or kV according to patient size and/or use of iterative reconstruction technique. CONTRAST:  OMNIPAQUE IOHEXOL 300 MG/ML  SOLN COMPARISON:  CT abdomen pelvis 10/27/2023 FINDINGS: Lower  chest: No acute abnormality. Hepatobiliary: No focal liver abnormality. No gallstones, gallbladder wall thickening, or pericholecystic fluid. No biliary dilatation. Pancreas: No focal lesion. Normal pancreatic contour. No surrounding inflammatory changes. No main pancreatic ductal dilatation. Spleen: Normal in size without focal abnormality. Adrenals/Urinary Tract: No adrenal nodule bilaterally. Bilateral kidneys enhance symmetrically. No hydronephrosis. No hydroureter. The urinary bladder is unremarkable. Stomach/Bowel: Stomach is within normal limits. No evidence of bowel wall thickening or dilatation. Appendix appears normal. Vascular/Lymphatic: No abdominal aorta or iliac aneurysm. No abdominal, pelvic, or inguinal lymphadenopathy. Reproductive: Uterus and bilateral adnexa are unremarkable. Other: No intraperitoneal free fluid. No intraperitoneal free gas. No organized fluid collection. Musculoskeletal: No abdominal wall hernia or abnormality. No suspicious lytic or blastic osseous lesions. No acute displaced fracture. IMPRESSION: No acute intra-abdominal or intrapelvic abnormality. Electronically Signed   By: Tish Frederickson M.D.   On: 02/23/2024 17:37    Procedures Procedures    Medications Ordered in ED Medications  lactated ringers bolus  1,000 mL (0 mLs Intravenous Stopped 02/23/24 1543)  ondansetron (ZOFRAN) injection 4 mg (4 mg Intravenous Given 02/23/24 1428)  morphine (PF) 4 MG/ML injection 4 mg (4 mg Intravenous Given 02/23/24 1531)  iohexol (OMNIPAQUE) 300 MG/ML solution 100 mL (100 mLs Intravenous Contrast Given 02/23/24 1613)    ED Course/ Medical Decision Making/ A&P                                 Medical Decision Making Amount and/or Complexity of Data Reviewed Labs: ordered. Radiology: ordered.  Risk Prescription drug management.   Patient presents to the ED for concern of abdominal pain, nausea, vomiting, diarrhea, this involves an extensive number of treatment options,  and is a complaint that carries with it a high risk of complications and morbidity.  The differential diagnosis includes gastroenteritis, food poisoning, COVID, flu, gastroparesis, bowel obstruction, perforation, IBD, pancreatitis, diverticulitis, bacterial infection   Co morbidities that complicate the patient evaluation  None   Additional history obtained:  Additional history obtained from Nursing   External records from outside source obtained and reviewed including triage RN note, CT imaging from 10/27/2023   Lab Tests:  I Ordered, and personally interpreted labs.  The pertinent results include:   No leukocytosis nor gross electrolyte abnormalities Lipase negative hCG negative UA negative for infection Respiratory panel negative   Imaging Studies ordered:  I ordered imaging studies including CT abd pelvis  I independently visualized and interpreted imaging which showed no acute abnormalities I agree with the radiologist interpretation     Medicines ordered and prescription drug management:  I ordered medication including morphine, Zofran, IVF for pain, nausea, dehydration Reevaluation of the patient after these medicines showed that the patient improved I have reviewed the patients home medicines and have made adjustments as needed    Problem List / ED Course:  Nausea, vomiting, diarrhea Generalized abdominal pain Mild tachycardia of 113 bpm likely secondary to pain and dehydration.  Provided morphine, Zofran, IVF with some improvement to symptoms and tachycardia.  No fever nor hypotension here in emergency department nor leukocytosis. Symptoms seem to be related to either food poisoning or a gastroenteritis picture Lab work is mostly reassuring with no leukocytosis, gross electrolyte abnormalities.  Lipase and hCG negative.  UA negative for infection with no symptoms.  Respiratory panel negative Patient endorses despite reassuring workup that she is interested in  in pursuing CT scan.  This is reasonable CT negative for acute abnormalities or etiology of symptoms Will provide patient with Zofran prescription for nausea, vomiting  Reevaluation:  After the interventions noted above, I reevaluated the patient and found that they have :improved   Social Determinants of Health:  Has PCP   Dispostion:  After consideration of the diagnostic results and the patients response to treatment, I feel that the patent would benefit from outpatient management symptomatic care.   Discussed ED workup, disposition, return to ED precautions with patient who expresses understanding agrees with plan.  All questions answered to their satisfaction.  They are agreeable to plan.  Discharge instructions provided on paperwork Final Clinical Impression(s) / ED Diagnoses Final diagnoses:  Nausea vomiting and diarrhea  Generalized abdominal pain    Rx / DC Orders ED Discharge Orders          Ordered    ondansetron (ZOFRAN-ODT) 4 MG disintegrating tablet  Every 8 hours PRN,   Status:  Discontinued  02/23/24 1845    ondansetron (ZOFRAN-ODT) 4 MG disintegrating tablet  Every 8 hours PRN        02/23/24 1852              Judithann Sheen, PA 02/23/24 1931    Rozelle Logan, DO 02/24/24 1610

## 2024-02-23 NOTE — ED Notes (Signed)
 Patient transported to CT

## 2024-02-23 NOTE — ED Triage Notes (Signed)
 Pt with NVD since last night, feels dehydrated; intermittent sharp, shooting pains all over abd
